# Patient Record
Sex: Female | Born: 1990 | Race: Black or African American | Hispanic: No | Marital: Married | Smoking: Never smoker
Health system: Southern US, Community
[De-identification: ages and names within clinical notes are randomized; demographics above are authoritative.]

## PROBLEM LIST (undated history)

## (undated) ENCOUNTER — Inpatient Hospital Stay (HOSPITAL_COMMUNITY): Payer: Self-pay

## (undated) DIAGNOSIS — Z789 Other specified health status: Secondary | ICD-10-CM

## (undated) HISTORY — PX: NO PAST SURGERIES: SHX2092

---

## 2017-01-16 ENCOUNTER — Inpatient Hospital Stay (HOSPITAL_COMMUNITY)
Admission: AD | Admit: 2017-01-16 | Discharge: 2017-01-16 | Disposition: A | Discharge status: Home / Self Care | Payer: Medicaid Other | Source: Ambulatory Visit | Attending: Obstetrics & Gynecology | Admitting: Obstetrics & Gynecology

## 2017-01-16 ENCOUNTER — Encounter (HOSPITAL_COMMUNITY): Payer: Self-pay

## 2017-01-16 DIAGNOSIS — E860 Dehydration: Secondary | ICD-10-CM | POA: Diagnosis not present

## 2017-01-16 DIAGNOSIS — O219 Vomiting of pregnancy, unspecified: Secondary | ICD-10-CM | POA: Insufficient documentation

## 2017-01-16 DIAGNOSIS — Z3A01 Less than 8 weeks gestation of pregnancy: Secondary | ICD-10-CM | POA: Insufficient documentation

## 2017-01-16 DIAGNOSIS — O99281 Endocrine, nutritional and metabolic diseases complicating pregnancy, first trimester: Secondary | ICD-10-CM | POA: Insufficient documentation

## 2017-01-16 HISTORY — DX: Other specified health status: Z78.9

## 2017-01-16 LAB — COMPREHENSIVE METABOLIC PANEL
ALBUMIN: 4.3 g/dL (ref 3.5–5.0)
ALK PHOS: 33 U/L — AB (ref 38–126)
ALT: 16 U/L (ref 14–54)
AST: 23 U/L (ref 15–41)
Anion gap: 11 (ref 5–15)
BILIRUBIN TOTAL: 1.2 mg/dL (ref 0.3–1.2)
BUN: 9 mg/dL (ref 6–20)
CALCIUM: 9.6 mg/dL (ref 8.9–10.3)
CHLORIDE: 102 mmol/L (ref 101–111)
CO2: 20 mmol/L — ABNORMAL LOW (ref 22–32)
CREATININE: 0.7 mg/dL (ref 0.44–1.00)
Glucose, Bld: 64 mg/dL — ABNORMAL LOW (ref 65–99)
Potassium: 3.7 mmol/L (ref 3.5–5.1)
Sodium: 133 mmol/L — ABNORMAL LOW (ref 135–145)
TOTAL PROTEIN: 7.9 g/dL (ref 6.5–8.1)

## 2017-01-16 LAB — CBC
HCT: 37.8 % (ref 36.0–46.0)
Hemoglobin: 13 g/dL (ref 12.0–15.0)
MCH: 30.7 pg (ref 26.0–34.0)
MCHC: 34.4 g/dL (ref 30.0–36.0)
MCV: 89.2 fL (ref 78.0–100.0)
PLATELETS: 250 10*3/uL (ref 150–400)
RBC: 4.24 MIL/uL (ref 3.87–5.11)
RDW: 12.6 % (ref 11.5–15.5)
WBC: 5.2 10*3/uL (ref 4.0–10.5)

## 2017-01-16 LAB — URINALYSIS, ROUTINE W REFLEX MICROSCOPIC
Bilirubin Urine: NEGATIVE
GLUCOSE, UA: NEGATIVE mg/dL
KETONES UR: 80 mg/dL — AB
Nitrite: NEGATIVE
PH: 5 (ref 5.0–8.0)
PROTEIN: 100 mg/dL — AB
Specific Gravity, Urine: 1.031 — ABNORMAL HIGH (ref 1.005–1.030)

## 2017-01-16 LAB — POCT PREGNANCY, URINE: Preg Test, Ur: POSITIVE — AB

## 2017-01-16 MED ORDER — PROMETHAZINE HCL 25 MG PO TABS: 25.0000 mg | ORAL_TABLET | Freq: Four times a day (QID) | ORAL | 0 refills | Status: DC | PRN

## 2017-01-16 MED ORDER — PROMETHAZINE HCL 25 MG/ML IJ SOLN
25.0000 mg | Freq: Once | INTRAMUSCULAR | Status: AC
  Administered 2017-01-16: 25 mg via INTRAVENOUS
  Filled 2017-01-16: qty 1

## 2017-01-16 MED ORDER — LACTATED RINGERS IV BOLUS (SEPSIS)
1000.0000 mL | Freq: Once | INTRAVENOUS | Status: AC
  Administered 2017-01-16: 1000 mL via INTRAVENOUS

## 2017-01-16 MED ORDER — FAMOTIDINE IN NACL 20-0.9 MG/50ML-% IV SOLN
20.0000 mg | Freq: Once | INTRAVENOUS | Status: AC
  Administered 2017-01-16: 20 mg via INTRAVENOUS
  Filled 2017-01-16: qty 50

## 2017-01-16 MED ORDER — M.V.I. ADULT IV INJ
Freq: Once | INTRAVENOUS | Status: AC
  Administered 2017-01-16: 12:00:00 via INTRAVENOUS
  Filled 2017-01-16: qty 10

## 2017-01-16 NOTE — MAU Provider Note (Signed)
History     CSN: [id number]  Arrival date and time: 01/16/17 4098   None     Chief Complaint  Patient presents with  . Emesis During Pregnancy   G1 @[redacted]w[redacted]d  here with N/V x1 week. She can only tolerate some cold water. In the last 24 hrs she has vomited 5+ times. No fevers. No diarrhea. No pain or bleeding. Reports 5 lb weight loss.     OB History    Gravida Para Term Preterm AB Living   1             SAB TAB Ectopic Multiple Live Births                  Past Medical History:  Diagnosis Date  . Medical history non-contributory     Past Surgical History:  Procedure Laterality Date  . NO PAST SURGERIES      History reviewed. No pertinent family history.  Social History  Substance Use Topics  . Smoking status: Never Smoker  . Smokeless tobacco: Never Used  . Alcohol use No    Allergies: No Known Allergies  No prescriptions prior to admission.    Review of Systems  Constitutional: Negative for fever.  Gastrointestinal: Positive for nausea and vomiting. Negative for abdominal pain.  Genitourinary: Negative for vaginal bleeding.  Neurological: Positive for weakness and light-headedness.   Physical Exam   Blood pressure 105/61, pulse 86, temperature 98.6 F (37 C), temperature source Oral, resp. rate 18, height 5\' 5"  (1.651 m), weight 102.5 kg (226 lb 1.3 oz), last menstrual period 11/28/2016.  Physical Exam  Nursing note and vitals reviewed. Constitutional: She is oriented to person, place, and time. She appears well-developed and well-nourished. No distress.  HENT:  Head: Normocephalic and atraumatic.  Neck: Normal range of motion.  Cardiovascular: Normal rate.   Respiratory: Effort normal.  Musculoskeletal: Normal range of motion.  Neurological: She is alert and oriented to person, place, and time.  Skin: Skin is warm and dry.  Psychiatric: She has a normal mood and affect.   Results for orders placed or performed during the hospital encounter of  01/16/17 (from the past 24 hour(s))  Urinalysis, Routine w reflex microscopic     Status: Abnormal   Collection Time: 01/16/17  8:37 AM  Result Value Ref Range   Color, Urine YELLOW YELLOW   APPearance HAZY (A) CLEAR   Specific Gravity, Urine 1.031 (H) 1.005 - 1.030   pH 5.0 5.0 - 8.0   Glucose, UA NEGATIVE NEGATIVE mg/dL   Hgb urine dipstick MODERATE (A) NEGATIVE   Bilirubin Urine NEGATIVE NEGATIVE   Ketones, ur 80 (A) NEGATIVE mg/dL   Protein, ur 119 (A) NEGATIVE mg/dL   Nitrite NEGATIVE NEGATIVE   Leukocytes, UA MODERATE (A) NEGATIVE   RBC / HPF 0-5 0 - 5 RBC/hpf   WBC, UA 6-30 0 - 5 WBC/hpf   Bacteria, UA MANY (A) NONE SEEN   Squamous Epithelial / LPF 0-5 (A) NONE SEEN   Mucous PRESENT   Pregnancy, urine POC     Status: Abnormal   Collection Time: 01/16/17  8:57 AM  Result Value Ref Range   Preg Test, Ur POSITIVE (A) NEGATIVE  Comprehensive metabolic panel     Status: Abnormal   Collection Time: 01/16/17  9:25 AM  Result Value Ref Range   Sodium 133 (L) 135 - 145 mmol/L   Potassium 3.7 3.5 - 5.1 mmol/L   Chloride 102 101 - 111 mmol/L   CO2  20 (L) 22 - 32 mmol/L   Glucose, Bld 64 (L) 65 - 99 mg/dL   BUN 9 6 - 20 mg/dL   Creatinine, Ser 1.610.70 0.44 - 1.00 mg/dL   Calcium 9.6 8.9 - 09.610.3 mg/dL   Total Protein 7.9 6.5 - 8.1 g/dL   Albumin 4.3 3.5 - 5.0 g/dL   AST 23 15 - 41 U/L   ALT 16 14 - 54 U/L   Alkaline Phosphatase 33 (L) 38 - 126 U/L   Total Bilirubin 1.2 0.3 - 1.2 mg/dL   GFR calc non Af Amer >60 >60 mL/min   GFR calc Af Amer >60 >60 mL/min   Anion gap 11 5 - 15  CBC     Status: None   Collection Time: 01/16/17  9:25 AM  Result Value Ref Range   WBC 5.2 4.0 - 10.5 K/uL   RBC 4.24 3.87 - 5.11 MIL/uL   Hemoglobin 13.0 12.0 - 15.0 g/dL   HCT 04.537.8 40.936.0 - 81.146.0 %   MCV 89.2 78.0 - 100.0 fL   MCH 30.7 26.0 - 34.0 pg   MCHC 34.4 30.0 - 36.0 g/dL   RDW 91.412.6 78.211.5 - 95.615.5 %   Platelets 250 150 - 400 K/uL    MAU Course  Procedures LR 1 L MTV 1 L Phenergan   Pepcid  MDM Labs ordered and reviewed. Tolerating po since meds. No emesis. Many bacteria on UA-UC sent. Stable for discharge home.   Assessment and Plan   1. [redacted] weeks gestation of pregnancy   2. Nausea/vomiting in pregnancy   3. Dehydration    Discharge home Follow up with OB provider of choice to start care Rx Phenergan Diclegis OTC regimen  Allergies as of 01/16/2017   No Known Allergies     Medication List    TAKE these medications   prenatal multivitamin Tabs tablet Take 1 tablet by mouth daily at 12 noon.   promethazine 25 MG tablet Commonly known as:  PHENERGAN Take 1 tablet (25 mg total) by mouth every 6 (six) hours as needed for nausea or vomiting.      Donette LarryMelanie Rim Thatch, CNM 01/16/2017, 10:38 AM

## 2017-01-16 NOTE — Discharge Instructions (Signed)
Morning Sickness °Morning sickness is when you feel sick to your stomach (nauseous) during pregnancy. This nauseous feeling may or may not come with vomiting. It often occurs in the morning but can be a problem any time of day. Morning sickness is most common during the first trimester, but it may continue throughout pregnancy. While morning sickness is unpleasant, it is usually harmless unless you develop severe and continual vomiting (hyperemesis gravidarum). This condition requires more intense treatment. °What are the causes? °The cause of morning sickness is not completely known but seems to be related to normal hormonal changes that occur in pregnancy. °What increases the risk? °You are at greater risk if you: °· Experienced nausea or vomiting before your pregnancy. °· Had morning sickness during a previous pregnancy. °· Are pregnant with more than one baby, such as twins. ° °How is this treated? °Do not use any medicines (prescription, over-the-counter, or herbal) for morning sickness without first talking to your health care provider. Your health care provider may prescribe or recommend: °· Vitamin B6 supplements. °· Anti-nausea medicines. °· The herbal medicine ginger. ° °Follow these instructions at home: °· Only take over-the-counter or prescription medicines as directed by your health care provider. °· Taking multivitamins before getting pregnant can prevent or decrease the severity of morning sickness in most women. °· Eat a piece of dry toast or unsalted crackers before getting out of bed in the morning. °· Eat five or six small meals a day. °· Eat dry and bland foods (rice, baked potato). Foods high in carbohydrates are often helpful. °· Do not drink liquids with your meals. Drink liquids between meals. °· Avoid greasy, fatty, and spicy foods. °· Get someone to cook for you if the smell of any food causes nausea and vomiting. °· If you feel nauseous after taking prenatal vitamins, take the vitamins at  night or with a snack. °· Snack on protein foods (nuts, yogurt, cheese) between meals if you are hungry. °· Eat unsweetened gelatins for desserts. °· Wearing an acupressure wristband (worn for sea sickness) may be helpful. °· Acupuncture may be helpful. °· Do not smoke. °· Get a humidifier to keep the air in your house free of odors. °· Get plenty of fresh air. °Contact a health care provider if: °· Your home remedies are not working, and you need medicine. °· You feel dizzy or lightheaded. °· You are losing weight. °Get help right away if: °· You have persistent and uncontrolled nausea and vomiting. °· You pass out (faint). °This information is not intended to replace advice given to you by your health care provider. Make sure you discuss any questions you have with your health care provider. °Document Released: 10/03/2006 Document Revised: 01/18/2016 Document Reviewed: 01/27/2013 °Elsevier Interactive Patient Education © 2017 [employer] ° °

## 2017-01-16 NOTE — MAU Note (Signed)
Pt reports she is pregnant and has not been eating. Has had N/V  Since last week. Stated her emesis looks like it has blood in it it is reddish/brown color.

## 2017-01-17 LAB — CULTURE, OB URINE

## 2017-01-23 ENCOUNTER — Inpatient Hospital Stay (HOSPITAL_COMMUNITY)
Admission: AD | Admit: 2017-01-23 | Discharge: 2017-01-23 | Disposition: A | Discharge status: Home / Self Care | Payer: Medicaid Other | Source: Ambulatory Visit | Attending: Family Medicine | Admitting: Family Medicine

## 2017-01-23 ENCOUNTER — Encounter (HOSPITAL_COMMUNITY): Payer: Self-pay | Admitting: *Deleted

## 2017-01-23 DIAGNOSIS — O210 Mild hyperemesis gravidarum: Secondary | ICD-10-CM | POA: Insufficient documentation

## 2017-01-23 DIAGNOSIS — E860 Dehydration: Secondary | ICD-10-CM | POA: Diagnosis not present

## 2017-01-23 DIAGNOSIS — O99281 Endocrine, nutritional and metabolic diseases complicating pregnancy, first trimester: Secondary | ICD-10-CM | POA: Insufficient documentation

## 2017-01-23 DIAGNOSIS — R1110 Vomiting, unspecified: Secondary | ICD-10-CM | POA: Diagnosis present

## 2017-01-23 DIAGNOSIS — Z3A08 8 weeks gestation of pregnancy: Secondary | ICD-10-CM | POA: Insufficient documentation

## 2017-01-23 LAB — URINALYSIS, ROUTINE W REFLEX MICROSCOPIC
BILIRUBIN URINE: NEGATIVE
GLUCOSE, UA: NEGATIVE mg/dL
KETONES UR: 80 mg/dL — AB
Nitrite: NEGATIVE
PH: 5 (ref 5.0–8.0)
PROTEIN: 30 mg/dL — AB
Specific Gravity, Urine: 1.028 (ref 1.005–1.030)

## 2017-01-23 MED ORDER — PROMETHAZINE HCL 25 MG/ML IJ SOLN
25.0000 mg | Freq: Once | INTRAMUSCULAR | Status: AC
  Administered 2017-01-23: 25 mg via INTRAVENOUS
  Filled 2017-01-23: qty 1

## 2017-01-23 MED ORDER — VITAMIN B-6 50 MG PO TABS: 50.0000 mg | ORAL_TABLET | Freq: Every day | ORAL | 0 refills | Status: DC

## 2017-01-23 MED ORDER — LACTATED RINGERS IV BOLUS (SEPSIS)
1000.0000 mL | Freq: Once | INTRAVENOUS | Status: AC
  Administered 2017-01-23: 1000 mL via INTRAVENOUS

## 2017-01-23 MED ORDER — DOXYLAMINE SUCCINATE (SLEEP) 25 MG PO TABS: 25.0000 mg | ORAL_TABLET | Freq: Every day | ORAL | 0 refills | Status: DC

## 2017-01-23 NOTE — MAU Note (Signed)
PT  SAYS  SHE CAME  HERE ON 5-24- FOR VOMITING - WAS DEHYDRATED-   GAVE  IV FLUIDS.  GAVE    PHENERGAN-   THEN  ABLE  TO EAT.    SAYS ON Tuesday  STARTED  VOMITING-  TOOK  PHENERGAN - BUT  DIDN'T  WORK . NOT  VOMITED  TODAY- BUT  NOT EATING

## 2017-01-23 NOTE — Discharge Instructions (Signed)
Morning Sickness °Morning sickness is when you feel sick to your stomach (nauseous) during pregnancy. This nauseous feeling may or may not come with vomiting. It often occurs in the morning but can be a problem any time of day. Morning sickness is most common during the first trimester, but it may continue throughout pregnancy. While morning sickness is unpleasant, it is usually harmless unless you develop severe and continual vomiting (hyperemesis gravidarum). This condition requires more intense treatment. °What are the causes? °The cause of morning sickness is not completely known but seems to be related to normal hormonal changes that occur in pregnancy. °What increases the risk? °You are at greater risk if you: °· Experienced nausea or vomiting before your pregnancy. °· Had morning sickness during a previous pregnancy. °· Are pregnant with more than one baby, such as twins. ° °How is this treated? °Do not use any medicines (prescription, over-the-counter, or herbal) for morning sickness without first talking to your health care provider. Your health care provider may prescribe or recommend: °· Vitamin B6 supplements. °· Anti-nausea medicines. °· The herbal medicine ginger. ° °Follow these instructions at home: °· Only take over-the-counter or prescription medicines as directed by your health care provider. °· Taking multivitamins before getting pregnant can prevent or decrease the severity of morning sickness in most women. °· Eat a piece of dry toast or unsalted crackers before getting out of bed in the morning. °· Eat five or six small meals a day. °· Eat dry and bland foods (rice, baked potato). Foods high in carbohydrates are often helpful. °· Do not drink liquids with your meals. Drink liquids between meals. °· Avoid greasy, fatty, and spicy foods. °· Get someone to cook for you if the smell of any food causes nausea and vomiting. °· If you feel nauseous after taking prenatal vitamins, take the vitamins at  night or with a snack. °· Snack on protein foods (nuts, yogurt, cheese) between meals if you are hungry. °· Eat unsweetened gelatins for desserts. °· Wearing an acupressure wristband (worn for sea sickness) may be helpful. °· Acupuncture may be helpful. °· Do not smoke. °· Get a humidifier to keep the air in your house free of odors. °· Get plenty of fresh air. °Contact a health care provider if: °· Your home remedies are not working, and you need medicine. °· You feel dizzy or lightheaded. °· You are losing weight. °Get help right away if: °· You have persistent and uncontrolled nausea and vomiting. °· You pass out (faint). °This information is not intended to replace advice given to you by your health care provider. Make sure you discuss any questions you have with your health care provider. °Document Released: 10/03/2006 Document Revised: 01/18/2016 Document Reviewed: 01/27/2013 °Elsevier Interactive Patient Education © 2017 [employer] ° ° °Eating Plan for Hyperemesis Gravidarum °Hyperemesis gravidarum is a severe form of morning sickness. Because this condition causes severe nausea and vomiting, it can lead to dehydration, malnutrition, and weight loss. One way to lessen the symptoms of nausea and vomiting is to follow the eating plan for hyperemesis gravidarum. It is often used along with prescribed medicines to control your symptoms. °What can I do to relieve my symptoms? °Listen to your body. Everyone is different and has different preferences. Find what works best for you. Take any of the following actions that are helpful to you: °· Eat and drink slowly. °· Eat 5-6 small meals daily instead of 3 large meals. °· Eat crackers before you get   out of bed in the morning. °· Try having a snack in the middle of the night. °· Starchy foods are usually tolerated well. Examples include cereal, toast, bread, potatoes, pasta, rice, and pretzels. °· Ginger may help with nausea. Add ¼ tsp ground ginger to hot tea or  choose ginger tea. °· Try drinking 100% fruit juice or an electrolyte drink. An electrolyte drink contains sodium, potassium, and chloride. °· Continue to take your prenatal vitamins as told by your health care provider. If you are having trouble taking your prenatal vitamins, talk with your health care provider about different options. °· Include at least 1 serving of protein with your meals and snacks. Protein options include meats or poultry, beans, nuts, eggs, and yogurt. Try eating a protein-rich snack before bed. Examples of these snacks include cheese and crackers or half of a peanut butter or turkey sandwich. °· Consider eliminating foods that trigger your symptoms. These may include spicy foods, coffee, high-fat foods, very sweet foods, and acidic foods. °· Try meals that have more protein combined with bland, salty, lower-fat, and dry foods, such as nuts, seeds, pretzels, crackers, and cereal. °· Talk with your healthcare provider about starting a supplement of vitamin B6. °· Have fluids that are cold, clear, and carbonated or sour. Examples include lemonade, ginger ale, lemon-lime soda, ice water, and sparkling water. °· Try lemon or mint tea. °· Try brushing your teeth or using a mouth rinse after meals. ° °What should I avoid to reduce my symptoms? °Avoiding some of the following things may help reduce your symptoms. °· Foods with strong smells. Try eating meals in well-ventilated areas that are free of odors. °· Drinking water or other beverages with meals. Try not to drink anything during the 30 minutes before and after your meals. °· Drinking more than 1 cup of fluid at a time. Sometimes using a straw helps. °· Fried or high-fat foods, such as butter and cream sauces. °· Spicy foods. °· Skipping meals as best as you can. Nausea can be more intense on an empty stomach. If you cannot tolerate food at that time, do not force it. Try sucking on ice chips or other frozen items, and make up for missed  calories later. °· Lying down within 2 hours after eating. °· Environmental triggers. These may include smoky rooms, closed spaces, rooms with strong smells, warm or humid places, overly loud and noisy rooms, and rooms with motion or flickering lights. °· Quick and sudden changes in your movement. ° °This information is not intended to replace advice given to you by your health care provider. Make sure you discuss any questions you have with your health care provider. °Document Released: 06/09/2007 Document Revised: 04/10/2016 Document Reviewed: 03/12/2016 °Elsevier Interactive Patient Education © 2018 Else[employer] Inc. ° °

## 2017-01-23 NOTE — MAU Provider Note (Signed)
History     CSN: [id number]  Arrival date and time: 01/23/17 1903  Chief Complaint  Patient presents with  . Emesis   G1 @[redacted]w[redacted]d  here with N/V. She had one episode of emesis 2 days ago and 2 episodes yesterday. Reports poor appetite today. She had water this am and tolerated ok. She was seen last week for similar sx and given Phenergan Rx. She was originally taking it up to 4 times a day then just once a day and did not take any today. No abdominal pain or VB. She's had 1 lb weight loss since last week.    OB History    Gravida Para Term Preterm AB Living   1             SAB TAB Ectopic Multiple Live Births                  Past Medical History:  Diagnosis Date  . Medical history non-contributory     Past Surgical History:  Procedure Laterality Date  . NO PAST SURGERIES      History reviewed. No pertinent family history.  Social History  Substance Use Topics  . Smoking status: Never Smoker  . Smokeless tobacco: Never Used  . Alcohol use No    Allergies: No Known Allergies  Prescriptions Prior to Admission  Medication Sig Dispense Refill Last Dose  . Prenatal Vit-Fe Fumarate-FA (PRENATAL MULTIVITAMIN) TABS tablet Take 1 tablet by mouth daily at 12 noon.   Past Week at Unknown time  . promethazine (PHENERGAN) 25 MG tablet Take 1 tablet (25 mg total) by mouth every 6 (six) hours as needed for nausea or vomiting. 30 tablet 0     Review of Systems  Constitutional: Negative for fever.  Gastrointestinal: Positive for nausea and vomiting. Negative for abdominal pain.  Genitourinary: Negative for vaginal bleeding.   Physical Exam   Blood pressure (!) 105/56, pulse 88, temperature 98.9 F (37.2 C), temperature source Oral, resp. rate 20, height 5\' 5"  (1.651 m), weight 102.2 kg (225 lb 4 oz), last menstrual period 11/28/2016.  Physical Exam  Constitutional: She is oriented to person, place, and time. She appears well-developed and well-nourished. No distress.  HENT:   Head: Normocephalic and atraumatic.  Cardiovascular: Normal rate.   Respiratory: Effort normal.  Musculoskeletal: Normal range of motion.  Neurological: She is alert and oriented to person, place, and time.  Skin: Skin is warm and dry.  Psychiatric: She has a normal mood and affect.   Results for orders placed or performed during the hospital encounter of 01/23/17 (from the past 24 hour(s))  Urinalysis, Routine w reflex microscopic     Status: Abnormal   Collection Time: 01/23/17  7:20 PM  Result Value Ref Range   Color, Urine YELLOW YELLOW   APPearance HAZY (A) CLEAR   Specific Gravity, Urine 1.028 1.005 - 1.030   pH 5.0 5.0 - 8.0   Glucose, UA NEGATIVE NEGATIVE mg/dL   Hgb urine dipstick MODERATE (A) NEGATIVE   Bilirubin Urine NEGATIVE NEGATIVE   Ketones, ur 80 (A) NEGATIVE mg/dL   Protein, ur 30 (A) NEGATIVE mg/dL   Nitrite NEGATIVE NEGATIVE   Leukocytes, UA SMALL (A) NEGATIVE   RBC / HPF 0-5 0 - 5 RBC/hpf   WBC, UA 6-30 0 - 5 WBC/hpf   Bacteria, UA RARE (A) NONE SEEN   Squamous Epithelial / LPF 0-5 (A) NONE SEEN   Mucous PRESENT    MAU Course  Procedures LR 1  L bolus Phenergan 25 mg IV  MDM Labs ordered and reviewed. No emesis. Tolerating po food and fluids. Discussed Diclegis OTC regimen. Stable for discharge home.   Assessment and Plan   1. [redacted] weeks gestation of pregnancy   2. Dehydration   3. Morning sickness    Discharge home Rx B6 Rx Unisom tab Continue Phenergan HEG diet Follow up with OB of choice in 2 weeks to start care  Allergies as of 01/23/2017   No Known Allergies     Medication List    TAKE these medications   doxylamine (Sleep) 25 MG tablet Commonly known as:  UNISOM Take 1 tablet (25 mg total) by mouth at bedtime.   prenatal multivitamin Tabs tablet Take 1 tablet by mouth daily at 12 noon.   promethazine 25 MG tablet Commonly known as:  PHENERGAN Take 1 tablet (25 mg total) by mouth every 6 (six) hours as needed for nausea or  vomiting.   pyridOXINE 50 MG tablet Commonly known as:  VITAMIN B-6 Take 1 tablet (50 mg total) by mouth at bedtime.      Donette LarryMelanie Rodderick Holtzer, CNM 01/23/2017, 8:37 PM

## 2017-01-29 ENCOUNTER — Encounter (HOSPITAL_COMMUNITY): Payer: Self-pay

## 2017-01-29 ENCOUNTER — Inpatient Hospital Stay (HOSPITAL_COMMUNITY)
Admission: AD | Admit: 2017-01-29 | Discharge: 2017-01-29 | Disposition: A | Discharge status: Home / Self Care | Payer: Medicaid Other | Source: Ambulatory Visit | Attending: Family Medicine | Admitting: Family Medicine

## 2017-01-29 DIAGNOSIS — Z3A08 8 weeks gestation of pregnancy: Secondary | ICD-10-CM | POA: Diagnosis not present

## 2017-01-29 DIAGNOSIS — O99281 Endocrine, nutritional and metabolic diseases complicating pregnancy, first trimester: Secondary | ICD-10-CM | POA: Insufficient documentation

## 2017-01-29 DIAGNOSIS — Z79899 Other long term (current) drug therapy: Secondary | ICD-10-CM | POA: Insufficient documentation

## 2017-01-29 DIAGNOSIS — E860 Dehydration: Secondary | ICD-10-CM | POA: Insufficient documentation

## 2017-01-29 DIAGNOSIS — O219 Vomiting of pregnancy, unspecified: Secondary | ICD-10-CM | POA: Diagnosis not present

## 2017-01-29 LAB — COMPREHENSIVE METABOLIC PANEL
ALT: 16 U/L (ref 14–54)
AST: 19 U/L (ref 15–41)
Albumin: 4.5 g/dL (ref 3.5–5.0)
Alkaline Phosphatase: 38 U/L (ref 38–126)
Anion gap: 11 (ref 5–15)
BUN: 9 mg/dL (ref 6–20)
CHLORIDE: 103 mmol/L (ref 101–111)
CO2: 21 mmol/L — AB (ref 22–32)
Calcium: 10 mg/dL (ref 8.9–10.3)
Creatinine, Ser: 0.74 mg/dL (ref 0.44–1.00)
GFR calc Af Amer: >60 mL/min (ref >60)
GFR calc non Af Amer: >60 mL/min (ref >60)
Glucose, Bld: 69 mg/dL (ref 65–99)
Potassium: 4.3 mmol/L (ref 3.5–5.1)
Sodium: 135 mmol/L (ref 135–145)
Total Bilirubin: 0.8 mg/dL (ref 0.3–1.2)
Total Protein: 8.3 g/dL — ABNORMAL HIGH (ref 6.5–8.1)

## 2017-01-29 LAB — CBC
HEMATOCRIT: 35.6 % — AB (ref 36.0–46.0)
Hemoglobin: 12.6 g/dL (ref 12.0–15.0)
MCH: 31.6 pg (ref 26.0–34.0)
MCHC: 35.4 g/dL (ref 30.0–36.0)
MCV: 89.2 fL (ref 78.0–100.0)
PLATELETS: 265 10*3/uL (ref 150–400)
RBC: 3.99 MIL/uL (ref 3.87–5.11)
RDW: 12.8 % (ref 11.5–15.5)
WBC: 5.4 10*3/uL (ref 4.0–10.5)

## 2017-01-29 LAB — URINALYSIS, ROUTINE W REFLEX MICROSCOPIC
Bilirubin Urine: NEGATIVE
Glucose, UA: NEGATIVE mg/dL
Ketones, ur: 80 mg/dL — AB
Nitrite: NEGATIVE
PROTEIN: 100 mg/dL — AB
Specific Gravity, Urine: 1.023 (ref 1.005–1.030)
pH: 5 (ref 5.0–8.0)

## 2017-01-29 MED ORDER — PROMETHAZINE HCL 25 MG PO TABS: 25.0000 mg | ORAL_TABLET | Freq: Four times a day (QID) | ORAL | 2 refills | Status: DC | PRN

## 2017-01-29 MED ORDER — PROMETHAZINE HCL 25 MG/ML IJ SOLN
25.0000 mg | Freq: Once | INTRAMUSCULAR | Status: AC
  Administered 2017-01-29: 25 mg via INTRAVENOUS
  Filled 2017-01-29: qty 1

## 2017-01-29 MED ORDER — PROMETHAZINE HCL 25 MG RE SUPP: 25.0000 mg | Freq: Four times a day (QID) | RECTAL | 0 refills | Status: DC | PRN

## 2017-01-29 MED ORDER — DEXTROSE 5 % IN LACTATED RINGERS IV BOLUS
1000.0000 mL | Freq: Once | INTRAVENOUS | Status: AC
  Administered 2017-01-29: 1000 mL via INTRAVENOUS

## 2017-01-29 MED ORDER — LACTATED RINGERS IV BOLUS (SEPSIS)
1000.0000 mL | Freq: Once | INTRAVENOUS | Status: AC
  Administered 2017-01-29: 1000 mL via INTRAVENOUS

## 2017-01-29 NOTE — MAU Provider Note (Signed)
History     CSN: [id number]  Arrival date and time: 01/29/17 0843   None     Chief Complaint  Patient presents with  . Nausea  . Emesis   HPI   Myrta is a 26 yo G1PO, [redacted]w[redacted]d determined by LMP who presents to the maternity admissions unit with complaints of nausea and vomiting.  She reports that she vomited twice yesterday, the first after eating small meal, and then in late last night after lying down.  She vomited this morning and says that she had nothing to eat.  She was seen on 5/31 for similar complaints and was given Phenergan Rx and says that she will throw up her medication as soon as she takes it.  This has been going on for 3-4 weeks.  She admits to dizziness and fatigue, and  denies any abdominal pain, vaginal bleeding or discharge, vaginal pain, constipation, or diarrhea.  OB History    Gravida Para Term Preterm AB Living   1             SAB TAB Ectopic Multiple Live Births                  Past Medical History:  Diagnosis Date  . Medical history non-contributory     Past Surgical History:  Procedure Laterality Date  . NO PAST SURGERIES      History reviewed. No pertinent family history.  Social History  Substance Use Topics  . Smoking status: Never Smoker  . Smokeless tobacco: Never Used  . Alcohol use No    Allergies: No Known Allergies  Prescriptions Prior to Admission  Medication Sig Dispense Refill Last Dose  . promethazine (PHENERGAN) 25 MG tablet Take 1 tablet (25 mg total) by mouth every 6 (six) hours as needed for nausea or vomiting. 30 tablet 0 01/28/2017 at Unknown time  . doxylamine, Sleep, (UNISOM) 25 MG tablet Take 1 tablet (25 mg total) by mouth at bedtime. 30 tablet 0 01/27/2017  . Prenatal Vit-Fe Fumarate-FA (PRENATAL MULTIVITAMIN) TABS tablet Take 1 tablet by mouth daily at 12 noon.   01/27/2017  . pyridOXINE (VITAMIN B-6) 50 MG tablet Take 1 tablet (50 mg total) by mouth at bedtime. 90 tablet 0 01/27/2017    Review of Systems   Constitutional: Positive for fatigue.  Gastrointestinal: Positive for nausea and vomiting. Negative for constipation and diarrhea.  Genitourinary: Negative for vaginal bleeding, vaginal discharge and vaginal pain.  Neurological: Positive for dizziness and light-headedness.   Physical Exam   Results for orders placed or performed during the hospital encounter of 01/29/17 (from the past 24 hour(s))  Urinalysis, Routine w reflex microscopic     Status: Abnormal   Collection Time: 01/29/17  9:00 AM  Result Value Ref Range   Color, Urine YELLOW YELLOW   APPearance HAZY (A) CLEAR   Specific Gravity, Urine 1.023 1.005 - 1.030   pH 5.0 5.0 - 8.0   Glucose, UA NEGATIVE NEGATIVE mg/dL   Hgb urine dipstick SMALL (A) NEGATIVE   Bilirubin Urine NEGATIVE NEGATIVE   Ketones, ur 80 (A) NEGATIVE mg/dL   Protein, ur 782 (A) NEGATIVE mg/dL   Nitrite NEGATIVE NEGATIVE   Leukocytes, UA TRACE (A) NEGATIVE   RBC / HPF 0-5 0 - 5 RBC/hpf   WBC, UA 6-30 0 - 5 WBC/hpf   Bacteria, UA MANY (A) NONE SEEN   Squamous Epithelial / LPF 0-5 (A) NONE SEEN   Mucous PRESENT   CBC  Status: Abnormal   Collection Time: 01/29/17  9:34 AM  Result Value Ref Range   WBC 5.4 4.0 - 10.5 K/uL   RBC 3.99 3.87 - 5.11 MIL/uL   Hemoglobin 12.6 12.0 - 15.0 g/dL   HCT 96.035.6 (L) 45.436.0 - 09.846.0 %   MCV 89.2 78.0 - 100.0 fL   MCH 31.6 26.0 - 34.0 pg   MCHC 35.4 30.0 - 36.0 g/dL   RDW 11.912.8 14.711.5 - 82.915.5 %   Platelets 265 150 - 400 K/uL   Blood pressure (!) 99/55, pulse 78, temperature 98.3 F (36.8 C), temperature source Oral, resp. rate 18, weight 99.3 kg (219 lb), last menstrual period 11/28/2016, SpO2 100 %.  Physical Exam  Constitutional: She is oriented to person, place, and time. She appears well-developed and well-nourished.  Cardiovascular: Normal rate, regular rhythm and normal heart sounds.   Respiratory: Effort normal and breath sounds normal.  GI: Soft. Bowel sounds are normal. There is no tenderness.   Neurological: She is alert and oriented to person, place, and time.  Skin: Skin is warm and dry.  Psychiatric: She has a normal mood and affect. Her behavior is normal.    MAU Course  Procedures  MDM CBC CMP UA Phenergan 25 mg IV LR 1 L D5LR x 1 L Labs ordered and reviewed.     Assessment and Plan  1. [redacted] weeks gestation of pregnancy 2. Nausea and Vomiting associated with pregnancy 3. Dehydration   Renew Rx for Phenergan PO and Rx for Phenergan suppositories. Pt to continue Unisom Q HS [facility].  Jeanann LewandowskyBethany Eaton, PA-S 01/29/2017, 9:59 AM   I confirm that I have verified the information documented in the physician assistant student's note and that I have also personally performed the physical exam and all medical decision making activities.

## 2017-01-29 NOTE — MAU Note (Signed)
+  nausea/vomiting x2-3 weeks States has been seen multiple times for this. Still unable to eat or drink anything. Cant hold down medication.

## 2017-02-13 ENCOUNTER — Other Ambulatory Visit (HOSPITAL_COMMUNITY): Payer: Self-pay | Admitting: Nurse Practitioner

## 2017-02-13 DIAGNOSIS — Z3682 Encounter for antenatal screening for nuchal translucency: Secondary | ICD-10-CM

## 2017-02-22 ENCOUNTER — Other Ambulatory Visit (HOSPITAL_COMMUNITY): Payer: Self-pay | Admitting: Advanced Practice Midwife

## 2017-02-23 ENCOUNTER — Inpatient Hospital Stay (HOSPITAL_COMMUNITY)
Admission: AD | Admit: 2017-02-23 | Discharge: 2017-02-23 | Disposition: A | Discharge status: Home / Self Care | Payer: Medicaid Other | Source: Ambulatory Visit | Attending: Family Medicine | Admitting: Family Medicine

## 2017-02-23 ENCOUNTER — Encounter (HOSPITAL_COMMUNITY): Payer: Self-pay | Admitting: *Deleted

## 2017-02-23 DIAGNOSIS — Z3A12 12 weeks gestation of pregnancy: Secondary | ICD-10-CM | POA: Insufficient documentation

## 2017-02-23 DIAGNOSIS — O210 Mild hyperemesis gravidarum: Secondary | ICD-10-CM | POA: Diagnosis not present

## 2017-02-23 DIAGNOSIS — O219 Vomiting of pregnancy, unspecified: Secondary | ICD-10-CM | POA: Diagnosis present

## 2017-02-23 LAB — URINALYSIS, ROUTINE W REFLEX MICROSCOPIC
BILIRUBIN URINE: NEGATIVE
Glucose, UA: NEGATIVE mg/dL
KETONES UR: 80 mg/dL — AB
Nitrite: NEGATIVE
PH: 5 (ref 5.0–8.0)
PROTEIN: 100 mg/dL — AB
Specific Gravity, Urine: 1.03 (ref 1.005–1.030)

## 2017-02-23 LAB — COMPREHENSIVE METABOLIC PANEL
ALBUMIN: 4.2 g/dL (ref 3.5–5.0)
ALK PHOS: 33 U/L — AB (ref 38–126)
ALT: 14 U/L (ref 14–54)
ANION GAP: 9 (ref 5–15)
AST: 18 U/L (ref 15–41)
BUN: 8 mg/dL (ref 6–20)
CO2: 22 mmol/L (ref 22–32)
Calcium: 9.7 mg/dL (ref 8.9–10.3)
Chloride: 105 mmol/L (ref 101–111)
Creatinine, Ser: 0.66 mg/dL (ref 0.44–1.00)
GFR calc Af Amer: >60 mL/min (ref >60)
GFR calc non Af Amer: >60 mL/min (ref >60)
GLUCOSE: 71 mg/dL (ref 65–99)
POTASSIUM: 3.9 mmol/L (ref 3.5–5.1)
SODIUM: 136 mmol/L (ref 135–145)
Total Bilirubin: 0.9 mg/dL (ref 0.3–1.2)
Total Protein: 7.6 g/dL (ref 6.5–8.1)

## 2017-02-23 LAB — CBC WITH DIFFERENTIAL/PLATELET
BASOS ABS: 0 10*3/uL (ref 0.0–0.1)
BASOS PCT: 0 %
EOS ABS: 0 10*3/uL (ref 0.0–0.7)
Eosinophils Relative: 0 %
HCT: 33.3 % — ABNORMAL LOW (ref 36.0–46.0)
Hemoglobin: 11.4 g/dL — ABNORMAL LOW (ref 12.0–15.0)
Lymphocytes Relative: 30 %
Lymphs Abs: 1.6 10*3/uL (ref 0.7–4.0)
MCH: 31.1 pg (ref 26.0–34.0)
MCHC: 34.2 g/dL (ref 30.0–36.0)
MCV: 91 fL (ref 78.0–100.0)
MONOS PCT: 3 %
Monocytes Absolute: 0.1 10*3/uL (ref 0.1–1.0)
NEUTROS ABS: 3.6 10*3/uL (ref 1.7–7.7)
NEUTROS PCT: 67 %
Platelets: 247 10*3/uL (ref 150–400)
RBC: 3.66 MIL/uL — ABNORMAL LOW (ref 3.87–5.11)
RDW: 13 % (ref 11.5–15.5)
WBC: 5.4 10*3/uL (ref 4.0–10.5)

## 2017-02-23 MED ORDER — ONDANSETRON 8 MG PO TBDP: 8.0000 mg | ORAL_TABLET | Freq: Three times a day (TID) | ORAL | 3 refills | Status: DC | PRN

## 2017-02-23 MED ORDER — DEXTROSE 5 % IN LACTATED RINGERS IV BOLUS
1000.0000 mL | Freq: Once | INTRAVENOUS | Status: AC
  Administered 2017-02-23: 1000 mL via INTRAVENOUS

## 2017-02-23 MED ORDER — LACTATED RINGERS IV SOLN
INTRAVENOUS | Status: DC
  Administered 2017-02-23: 10:00:00 via INTRAVENOUS

## 2017-02-23 MED ORDER — PROMETHAZINE HCL 25 MG RE SUPP: 25.0000 mg | Freq: Four times a day (QID) | RECTAL | 4 refills | Status: DC | PRN

## 2017-02-23 MED ORDER — ONDANSETRON HCL 4 MG/2ML IJ SOLN
4.0000 mg | Freq: Once | INTRAMUSCULAR | Status: AC
  Administered 2017-02-23: 4 mg via INTRAVENOUS
  Filled 2017-02-23: qty 2

## 2017-02-23 NOTE — MAU Note (Signed)
Pt presents to MAU with complaints of N/V. Denies any VB or abnormal discharge. PT was evaluated in MAU on June the 6th and given medications for nausea

## 2017-02-23 NOTE — MAU Note (Signed)
C/o N&V for past 2 months; states that she is losing weight ( approximately 18 lbs); c/o weakness throughout the pregnancy so far;

## 2017-02-23 NOTE — MAU Provider Note (Signed)
Chief Complaint: Nausea and Fatigue   First Provider Initiated Contact with Patient 02/23/17 0902     SUBJECTIVE HPI: Stephanie Choi is a 26 y.o. G1P0 at [redacted]w[redacted]d who presents to Maternity Admissions reporting continued nausea and vomiting and poor appetite despite using Phenergan tablets and suppositories, Unisom and B-6 for nausea and vomiting of pregnancy. 14 pound weight loss (6%) since first visit to maternity admissions 01/16/2017. Patient reports reduction of episodes of emesis to 2-3 per day, but still vomits almost every time she eats or drinks.  Duration: 2 months Context: Early pregnancy Modifying factors: Mild improvement with Phenergan, Unisom and B-6. Associated signs and symptoms: Negative for fever, chills, urinary complaints, diarrhea, sick contacts. Positive for decreased appetite, weakness, poor appetite, "feeling like something is in my throat".  Started prenatal care at [facility] Department.  Past Medical History:  Diagnosis Date  . Medical history non-contributory    OB History  Gravida Para Term Preterm AB Living  1            SAB TAB Ectopic Multiple Live Births               # Outcome Date GA Lbr Len/2nd Weight Sex Delivery Anes PTL Lv  1 Current              Past Surgical History:  Procedure Laterality Date  . NO PAST SURGERIES     Social History   Social History  . Marital status: Married    Spouse name: N/A  . Number of children: N/A  . Years of education: N/A   Occupational History  . Not on file.   Social History Main Topics  . Smoking status: Never Smoker  . Smokeless tobacco: Never Used  . Alcohol use No  . Drug use: No  . Sexual activity: Yes    Birth control/ protection: None   Other Topics Concern  . Not on file   Social History Narrative  . No narrative on file   History reviewed. No pertinent family history. No current facility-administered medications on file prior to encounter.    No current outpatient  prescriptions on file prior to encounter.   No Known Allergies  I have reviewed patient's Past Medical Hx, Surgical Hx, Family Hx, Social Hx, medications and allergies.   Review of Systems  Constitutional: Positive for appetite change and unexpected weight change. Negative for chills and fever.  Gastrointestinal: Positive for nausea and vomiting. Negative for abdominal pain, constipation and diarrhea.  Genitourinary: Negative for dysuria, flank pain, frequency, hematuria, urgency and vaginal bleeding.  Musculoskeletal: Negative for back pain.  Neurological: Positive for weakness.    OBJECTIVE Patient Vitals for the past 24 hrs:  BP Temp Temp src Pulse Resp Weight  02/23/17 0833 110/68 99.7 F (37.6 C) Oral 89 16 -  02/23/17 0817 129/71 98.7 F (37.1 C) - 97 18 212 lb (96.2 kg)   Constitutional: Well-developed, well-nourished, Overweight female in no acute distress.  Head: Mucous membranes dry. Cardiovascular: normal rate Respiratory: normal rate and effort.  GI: Abd soft, non-tender. Neurologic: Alert and oriented x 4.  GU: Fetal heart rate 162 by Doppler  LAB RESULTS Results for orders placed or performed during the hospital encounter of 02/23/17 (from the past 24 hour(s))  Urinalysis, Routine w reflex microscopic     Status: Abnormal   Collection Time: 02/23/17  8:17 AM  Result Value Ref Range   Color, Urine YELLOW YELLOW   APPearance CLOUDY (A) CLEAR  Specific Gravity, Urine 1.030 1.005 - 1.030   pH 5.0 5.0 - 8.0   Glucose, UA NEGATIVE NEGATIVE mg/dL   Hgb urine dipstick MODERATE (A) NEGATIVE   Bilirubin Urine NEGATIVE NEGATIVE   Ketones, ur 80 (A) NEGATIVE mg/dL   Protein, ur 161 (A) NEGATIVE mg/dL   Nitrite NEGATIVE NEGATIVE   Leukocytes, UA SMALL (A) NEGATIVE   RBC / HPF TOO NUMEROUS TO COUNT 0 - 5 RBC/hpf   WBC, UA TOO NUMEROUS TO COUNT 0 - 5 WBC/hpf   Bacteria, UA RARE (A) NONE SEEN   Squamous Epithelial / LPF 0-5 (A) NONE SEEN   Mucous PRESENT   CBC with  Differential/Platelet     Status: Abnormal   Collection Time: 02/23/17  9:20 AM  Result Value Ref Range   WBC 5.4 4.0 - 10.5 K/uL   RBC 3.66 (L) 3.87 - 5.11 MIL/uL   Hemoglobin 11.4 (L) 12.0 - 15.0 g/dL   HCT 09.6 (L) 04.5 - 40.9 %   MCV 91.0 78.0 - 100.0 fL   MCH 31.1 26.0 - 34.0 pg   MCHC 34.2 30.0 - 36.0 g/dL   RDW 81.1 91.4 - 78.2 %   Platelets 247 150 - 400 K/uL   Neutrophils Relative % 67 %   Neutro Abs 3.6 1.7 - 7.7 K/uL   Lymphocytes Relative 30 %   Lymphs Abs 1.6 0.7 - 4.0 K/uL   Monocytes Relative 3 %   Monocytes Absolute 0.1 0.1 - 1.0 K/uL   Eosinophils Relative 0 %   Eosinophils Absolute 0.0 0.0 - 0.7 K/uL   Basophils Relative 0 %   Basophils Absolute 0.0 0.0 - 0.1 K/uL  Comprehensive metabolic panel     Status: Abnormal   Collection Time: 02/23/17  9:20 AM  Result Value Ref Range   Sodium 136 135 - 145 mmol/L   Potassium 3.9 3.5 - 5.1 mmol/L   Chloride 105 101 - 111 mmol/L   CO2 22 22 - 32 mmol/L   Glucose, Bld 71 65 - 99 mg/dL   BUN 8 6 - 20 mg/dL   Creatinine, Ser 9.56 0.44 - 1.00 mg/dL   Calcium 9.7 8.9 - 21.3 mg/dL   Total Protein 7.6 6.5 - 8.1 g/dL   Albumin 4.2 3.5 - 5.0 g/dL   AST 18 15 - 41 U/L   ALT 14 14 - 54 U/L   Alkaline Phosphatase 33 (L) 38 - 126 U/L   Total Bilirubin 0.9 0.3 - 1.2 mg/dL   GFR calc non Af Amer >60 >60 mL/min   GFR calc Af Amer >60 >60 mL/min   Anion gap 9 5 - 15    IMAGING No results found.  MAU COURSE Orders Placed This Encounter  Procedures  . Culture, OB Urine  . Urinalysis, Routine w reflex microscopic  . CBC with Differential/Platelet  . Comprehensive metabolic panel  . Discharge patient   Meds ordered this encounter  Medications  . dextrose 5% lactated ringers bolus 1,000 mL  . ondansetron (ZOFRAN) injection 4 mg  . lactated ringers infusion  . Prenatal Vit-Fe Phos-FA-Omega (VITAFOL GUMMIES) 3.33-0.333-34.8 MG CHEW    Sig: Chew 1 each by mouth 3 (three) times daily.  . promethazine (PHENERGAN) 25 MG  suppository    Sig: Place 1 suppository (25 mg total) rectally every 6 (six) hours as needed for nausea or vomiting.    Dispense:  30 suppository    Refill:  4    unable to keep down promethazine tabs  Order Specific Question:   Supervising Provider    Answer:   Willodean RosenthalHARRAWAY-Webber Michiels, CAROLYN [[id number]]  . ondansetron (ZOFRAN ODT) 8 MG disintegrating tablet    Sig: Take 1 tablet (8 mg total) by mouth every 8 (eight) hours as needed for nausea or vomiting.    Dispense:  20 tablet    Refill:  3    Order Specific Question:   Supervising Provider    Answer:   Willodean RosenthalHARRAWAY-Marselino Slayton, CAROLYN [[id number]]   Tolerating ginger ale and crackers well after Zofran.. Feeling much better after IV fluids.  MDM - Hyperemesis controlled with Zofran. Rx Zofran. Constipation precautions. Suggested alternating  Phenergan at night and Zofran in the day due to side effects of each.  ASSESSMENT 1. Hyperemesis affecting pregnancy, antepartum     PLAN Discharge home in stable condition. Hyperemesis precautions Follow-up Information    [facility].   Why:  As scheduled for prenatal visit Contact information: [address] [place] [phone]        [facility] Follow up.   Why:  In emergencies Contact information: [address] [id number] [place] [id number] [phone]         Allergies as of 02/23/2017   No Known Allergies     Medication List    TAKE these medications   ondansetron 8 MG disintegrating tablet Commonly known as:  ZOFRAN ODT Take 1 tablet (8 mg total) by mouth every 8 (eight) hours as needed for nausea or vomiting.   promethazine 25 MG suppository Commonly known as:  PHENERGAN Place 1 suppository (25 mg total) rectally every 6 (six) hours as needed for nausea or vomiting. What changed:  reasons to take this   VITAFOL GUMMIES 3.33-0.333-34.8 MG Chew Chew  1 each by mouth 3 (three) times daily.        Katrinka BlazingSmith, [place], CNM 02/23/2017  11:09 AM

## 2017-02-23 NOTE — Discharge Instructions (Signed)
Hyperemesis Gravidarum °Hyperemesis gravidarum is a severe form of nausea and vomiting that happens during pregnancy. Hyperemesis is worse than morning sickness. It may cause you to have nausea or vomiting all day for many days. It may keep you from eating and drinking enough food and liquids. Hyperemesis usually occurs during the first half (the first 20 weeks) of pregnancy. It often goes away once a woman is in her second half of pregnancy. However, sometimes hyperemesis continues through an entire pregnancy. °What are the causes? °The cause of this condition is not known. It may be related to changes in chemicals (hormones) in the body during pregnancy, such as the high level of pregnancy hormone (human chorionic gonadotropin) or the increase in the female sex hormone (estrogen). °What are the signs or symptoms? °Symptoms of this condition include: °· Severe nausea and vomiting. °· Nausea that does not go away. °· Vomiting that does not allow you to keep any food down. °· Weight loss. °· Body fluid loss (dehydration). °· Having no desire to eat, or not liking food that you have previously enjoyed. ° °How is this diagnosed? °This condition may be diagnosed based on: °· A physical exam. °· Your medical history. °· Your symptoms. °· Blood tests. °· Urine tests. ° °How is this treated? °This condition may be managed with medicine. If medicines to do not help relieve nausea and vomiting, you may need to receive fluids through an IV tube at the hospital. °Follow these instructions at home: °· Take over-the-counter and prescription medicines only as told by your health care provider. °· Avoid iron pills and multivitamins that contain iron for the first 3-4 months of pregnancy. If you take prescription iron pills, do not stop taking them unless your health care provider approves. °· Take the following actions to help prevent nausea and vomiting: °? In the morning, before getting out of bed, try eating a couple of dry  crackers or a piece of toast. °? Avoid foods and smells that upset your stomach. Fatty and spicy foods may make nausea worse. °? Eat 5-6 small meals a day. °? Do not drink fluids while eating meals. Drink between meals. °? Eat or suck on things that have ginger in them. Ginger can help relieve nausea. °? Avoid food preparation. The smell of food can spoil your appetite or trigger nausea. °· Follow instructions from your health care provider about eating or drinking restrictions. °· For snacks, eat high-protein foods, such as cheese. °· Keep all follow-up and pre-birth (prenatal) visits as told by your health care provider. This is important. °Contact a health care provider if: °· You have pain in your abdomen. °· You have a severe headache. °· You have vision problems. °· You are losing weight. °Get help right away if: °· You cannot drink fluids without vomiting. °· You vomit blood. °· You have constant nausea and vomiting. °· You are very weak. °· You are very thirsty. °· You feel dizzy. °· You faint. °· You have a fever or other symptoms that last for more than 2-3 days. °· You have a fever and your symptoms suddenly get worse. °Summary °· Hyperemesis gravidarum is a severe form of nausea and vomiting that happens during pregnancy. °· Making some changes to your eating habits may help relieve nausea and vomiting. °· This condition may be managed with medicine. °· If medicines to do not help relieve nausea and vomiting, you may need to receive fluids through an IV tube at the hospital. °This   information is not intended to replace advice given to you by your health care provider. Make sure you discuss any questions you have with your health care provider. °Document Released: 08/12/2005 Document Revised: 04/10/2016 Document Reviewed: 04/10/2016 °Elsevier Interactive Patient Education © 2017 [employer] Inc[employer] °Eating Plan for Hyperemesis Gravidarum °Hyperemesis gravidarum is a severe form of morning sickness. Because this  condition causes severe nausea and vomiting, it can lead to dehydration, malnutrition, and weight loss. One way to lessen the symptoms of nausea and vomiting is to follow the eating plan for hyperemesis gravidarum. It is often used along with prescribed medicines to control your symptoms. °What can I do to relieve my symptoms? °Listen to your body. Everyone is different and has different preferences. Find what works best for you. Take any of the following actions that are helpful to you: °· Eat and drink slowly. °· Eat 5-6 small meals daily instead of 3 large meals. °· Eat crackers before you get out of bed in the morning. °· Try having a snack in the middle of the night. °· Starchy foods are usually tolerated well. Examples include cereal, toast, bread, potatoes, pasta, rice, and pretzels. °· Ginger may help with nausea. Add ¼ tsp ground ginger to hot tea or choose ginger tea. °· Try drinking 100% fruit juice or an electrolyte drink. An electrolyte drink contains sodium, potassium, and chloride. °· Continue to take your prenatal vitamins as told by your health care provider. If you are having trouble taking your prenatal vitamins, talk with your health care provider about different options. °· Include at least 1 serving of protein with your meals and snacks. Protein options include meats or poultry, beans, nuts, eggs, and yogurt. Try eating a protein-rich snack before bed. Examples of these snacks include cheese and crackers or half of a peanut butter or turkey sandwich. °· Consider eliminating foods that trigger your symptoms. These may include spicy foods, coffee, high-fat foods, very sweet foods, and acidic foods. °· Try meals that have more protein combined with bland, salty, lower-fat, and dry foods, such as nuts, seeds, pretzels, crackers, and cereal. °· Talk with your healthcare provider about starting a supplement of vitamin B6. °· Have fluids that are cold, clear, and carbonated or sour. Examples include  lemonade, ginger ale, lemon-lime soda, ice water, and sparkling water. °· Try lemon or mint tea. °· Try brushing your teeth or using a mouth rinse after meals. ° °What should I avoid to reduce my symptoms? °Avoiding some of the following things may help reduce your symptoms. °· Foods with strong smells. Try eating meals in well-ventilated areas that are free of odors. °· Drinking water or other beverages with meals. Try not to drink anything during the 30 minutes before and after your meals. °· Drinking more than 1 cup of fluid at a time. Sometimes using a straw helps. °· Fried or high-fat foods, such as butter and cream sauces. °· Spicy foods. °· Skipping meals as best as you can. Nausea can be more intense on an empty stomach. If you cannot tolerate food at that time, do not force it. Try sucking on ice chips or other frozen items, and make up for missed calories later. °· Lying down within 2 hours after eating. °· Environmental triggers. These may include smoky rooms, closed spaces, rooms with strong smells, warm or humid places, overly loud and noisy rooms, and rooms with motion or flickering lights. °· Quick and sudden changes in your movement. ° °This information is not   intended to replace advice given to you by your health care provider. Make sure you discuss any questions you have with your health care provider. °Document Released: 06/09/2007 Document Revised: 04/10/2016 Document Reviewed: 03/12/2016 °Elsevier Interactive Patient Education © 2018 Elsevier Inc. ° °

## 2017-02-25 ENCOUNTER — Other Ambulatory Visit (HOSPITAL_COMMUNITY): Payer: Self-pay | Admitting: Nurse Practitioner

## 2017-02-25 ENCOUNTER — Ambulatory Visit (HOSPITAL_COMMUNITY)
Admission: RE | Admit: 2017-02-25 | Discharge: 2017-02-25 | Disposition: A | Discharge status: Home / Self Care | Payer: Medicaid Other | Source: Ambulatory Visit | Attending: Nurse Practitioner | Admitting: Nurse Practitioner

## 2017-02-25 ENCOUNTER — Encounter (HOSPITAL_COMMUNITY): Payer: Self-pay

## 2017-02-25 DIAGNOSIS — O99212 Obesity complicating pregnancy, second trimester: Secondary | ICD-10-CM

## 2017-02-25 DIAGNOSIS — Z3682 Encounter for antenatal screening for nuchal translucency: Secondary | ICD-10-CM | POA: Insufficient documentation

## 2017-02-25 DIAGNOSIS — Z3A18 18 weeks gestation of pregnancy: Secondary | ICD-10-CM | POA: Insufficient documentation

## 2017-02-25 DIAGNOSIS — Z3A12 12 weeks gestation of pregnancy: Secondary | ICD-10-CM

## 2017-02-25 LAB — CULTURE, OB URINE: Culture: NO GROWTH

## 2017-03-03 ENCOUNTER — Encounter: Payer: Self-pay | Admitting: Advanced Practice Midwife

## 2017-03-03 MED ORDER — PROMETHAZINE HCL 25 MG RE SUPP: 25.0000 mg | Freq: Four times a day (QID) | RECTAL | 2 refills | Status: DC | PRN

## 2017-03-12 ENCOUNTER — Other Ambulatory Visit (HOSPITAL_COMMUNITY): Payer: Self-pay

## 2017-03-19 ENCOUNTER — Inpatient Hospital Stay (HOSPITAL_COMMUNITY)
Admission: AD | Admit: 2017-03-19 | Discharge: 2017-03-19 | Disposition: A | Discharge status: Home / Self Care | Payer: Medicaid Other | Source: Ambulatory Visit | Attending: Obstetrics & Gynecology | Admitting: Obstetrics & Gynecology

## 2017-03-19 ENCOUNTER — Encounter (HOSPITAL_COMMUNITY): Payer: Self-pay

## 2017-03-19 DIAGNOSIS — R531 Weakness: Secondary | ICD-10-CM | POA: Diagnosis present

## 2017-03-19 DIAGNOSIS — O26812 Pregnancy related exhaustion and fatigue, second trimester: Secondary | ICD-10-CM | POA: Diagnosis not present

## 2017-03-19 DIAGNOSIS — R51 Headache: Secondary | ICD-10-CM | POA: Insufficient documentation

## 2017-03-19 DIAGNOSIS — O210 Mild hyperemesis gravidarum: Secondary | ICD-10-CM

## 2017-03-19 DIAGNOSIS — Z3A15 15 weeks gestation of pregnancy: Secondary | ICD-10-CM

## 2017-03-19 DIAGNOSIS — O26892 Other specified pregnancy related conditions, second trimester: Secondary | ICD-10-CM | POA: Insufficient documentation

## 2017-03-19 LAB — URINALYSIS, ROUTINE W REFLEX MICROSCOPIC
BILIRUBIN URINE: NEGATIVE
Glucose, UA: NEGATIVE mg/dL
Hgb urine dipstick: NEGATIVE
KETONES UR: NEGATIVE mg/dL
Nitrite: NEGATIVE
PH: 6 (ref 5.0–8.0)
PROTEIN: NEGATIVE mg/dL
Specific Gravity, Urine: 1.015 (ref 1.005–1.030)

## 2017-03-19 LAB — COMPREHENSIVE METABOLIC PANEL
ALBUMIN: 3.7 g/dL (ref 3.5–5.0)
ALT: 15 U/L (ref 14–54)
ANION GAP: 8 (ref 5–15)
AST: 17 U/L (ref 15–41)
Alkaline Phosphatase: 34 U/L — ABNORMAL LOW (ref 38–126)
BUN: 6 mg/dL (ref 6–20)
CHLORIDE: 103 mmol/L (ref 101–111)
CO2: 23 mmol/L (ref 22–32)
Calcium: 9.2 mg/dL (ref 8.9–10.3)
Creatinine, Ser: 0.61 mg/dL (ref 0.44–1.00)
GFR calc non Af Amer: >60 mL/min (ref >60)
GLUCOSE: 84 mg/dL (ref 65–99)
Potassium: 3.6 mmol/L (ref 3.5–5.1)
SODIUM: 134 mmol/L — AB (ref 135–145)
Total Bilirubin: 0.4 mg/dL (ref 0.3–1.2)
Total Protein: 7.3 g/dL (ref 6.5–8.1)

## 2017-03-19 LAB — CBC
HCT: 29.8 % — ABNORMAL LOW (ref 36.0–46.0)
HEMOGLOBIN: 10.2 g/dL — AB (ref 12.0–15.0)
MCH: 31.5 pg (ref 26.0–34.0)
MCHC: 34.2 g/dL (ref 30.0–36.0)
MCV: 92 fL (ref 78.0–100.0)
PLATELETS: 231 10*3/uL (ref 150–400)
RBC: 3.24 MIL/uL — AB (ref 3.87–5.11)
RDW: 12.8 % (ref 11.5–15.5)
WBC: 6 10*3/uL (ref 4.0–10.5)

## 2017-03-19 MED ORDER — ACETAMINOPHEN 500 MG PO TABS
1000.0000 mg | ORAL_TABLET | Freq: Once | ORAL | Status: AC
  Administered 2017-03-19: 1000 mg via ORAL
  Filled 2017-03-19: qty 2

## 2017-03-19 NOTE — MAU Note (Addendum)
Pt is here with c/o body aches, headache, weakness, and back pain. Pain comes and goes. Denies any bleeding or leaking.

## 2017-03-19 NOTE — MAU Provider Note (Signed)
History     CSN: [id number]  Arrival date and time: 03/19/17 [id number]   First Provider Initiated Contact with Patient 03/19/17 2106     Chief Complaint  Patient presents with  . Back Pain  . Weakness  . Generalized Body Aches   HPI Stephanie Choi is a 26 y.o. G1P0 at [id number] who presents complaining of weakness, headache and intermittent back pain. She states the back pain has been going on for 2 weeks and is worse when she sits up. She rates it a 6/10 and has not tried anything for the pain. She states she has not drank any water today and ate less than normal because she has no appetite. Patient still reports nausea and vomiting but it is getting better. No vomiting today. She denies any vaginal bleeding or discharge.   OB History    Gravida Para Term Preterm AB Living   1             SAB TAB Ectopic Multiple Live Births                  Past Medical History:  Diagnosis Date  . Medical history non-contributory     Past Surgical History:  Procedure Laterality Date  . NO PAST SURGERIES      No family history on file.  Social History  Substance Use Topics  . Smoking status: Never Smoker  . Smokeless tobacco: Never Used  . Alcohol use No    Allergies: No Known Allergies  Prescriptions Prior to Admission  Medication Sig Dispense Refill Last Dose  . ondansetron (ZOFRAN ODT) 8 MG disintegrating tablet Take 1 tablet (8 mg total) by mouth every 8 (eight) hours as needed for nausea or vomiting. 20 tablet 3 03/19/2017 at Unknown time  . Prenatal Vit-Fe Phos-FA-Omega (VITAFOL GUMMIES) 3.33-0.333-34.8 MG CHEW Chew 1 each by mouth 3 (three) times daily.   Past Week at Unknown time  . promethazine (PHENERGAN) 25 MG suppository Place 1 suppository (25 mg total) rectally every 6 (six) hours as needed for nausea or vomiting. 30 suppository 4 Taking  . promethazine (PHENERGAN) 25 MG suppository Place 1 suppository (25 mg total) rectally every 6 (six) hours as needed for nausea or  vomiting. 20 suppository 2     Review of Systems  Constitutional: Positive for appetite change. Negative for chills and fever.  HENT: Negative.   Respiratory: Negative.  Negative for shortness of breath.   Cardiovascular: Negative.  Negative for chest pain.  Gastrointestinal: Negative.  Negative for abdominal pain, constipation, diarrhea, nausea and vomiting.  Genitourinary: Negative.  Negative for dysuria, frequency, urgency, vaginal bleeding and vaginal discharge.  Musculoskeletal: Positive for back pain.  Neurological: Positive for weakness and headaches. Negative for dizziness.  Psychiatric/Behavioral: Negative.    Physical Exam   Blood pressure (!) 107/49, pulse 80, temperature 98.7 F (37.1 C), temperature source Oral, resp. rate 17, height 5\' 5"  (1.651 m), weight 215 lb (97.5 kg), last menstrual period 11/28/2016, SpO2 100 %.  Orthostatic VS for the past 24 hrs (Last 3 readings):  BP- Lying Pulse- Lying BP- Sitting Pulse- Sitting BP- Standing at 0 minutes Pulse- Standing at 0 minutes BP- Standing at 3 minutes Pulse- Standing at 3 minutes  03/19/17 2216 107/49 80 125/68 79 (!) 136/38 91 138/66 93    Physical Exam  Nursing note and vitals reviewed. Constitutional: She is oriented to person, place, and time. She appears well-developed and well-nourished.  HENT:  Head: Normocephalic and atraumatic.  Eyes:  Conjunctivae are normal. No scleral icterus.  Cardiovascular: Normal rate, regular rhythm and normal heart sounds.   Respiratory: Effort normal and breath sounds normal. No respiratory distress.  GI: Soft. She exhibits no distension. There is no tenderness. There is no guarding.  Neurological: She is alert and oriented to person, place, and time.  Skin: Skin is warm and dry.  Psychiatric: She has a normal mood and affect. Her behavior is normal. Judgment and thought content normal.   FHT: 159 bpm  MAU Course  Procedures Results for orders placed or performed during the  hospital encounter of 03/19/17 (from the past 24 hour(s))  Urinalysis, Routine w reflex microscopic     Status: Abnormal   Collection Time: 03/19/17  8:53 PM  Result Value Ref Range   Color, Urine YELLOW YELLOW   APPearance HAZY (A) CLEAR   Specific Gravity, Urine 1.015 1.005 - 1.030   pH 6.0 5.0 - 8.0   Glucose, UA NEGATIVE NEGATIVE mg/dL   Hgb urine dipstick NEGATIVE NEGATIVE   Bilirubin Urine NEGATIVE NEGATIVE   Ketones, ur NEGATIVE NEGATIVE mg/dL   Protein, ur NEGATIVE NEGATIVE mg/dL   Nitrite NEGATIVE NEGATIVE   Leukocytes, UA MODERATE (A) NEGATIVE   RBC / HPF 0-5 0 - 5 RBC/hpf   WBC, UA 0-5 0 - 5 WBC/hpf   Bacteria, UA MANY (A) NONE SEEN   Squamous Epithelial / LPF 0-5 (A) NONE SEEN   Mucous PRESENT   CBC     Status: Abnormal   Collection Time: 03/19/17  9:34 PM  Result Value Ref Range   WBC 6.0 4.0 - 10.5 K/uL   RBC 3.24 (L) 3.87 - 5.11 MIL/uL   Hemoglobin 10.2 (L) 12.0 - 15.0 g/dL   HCT 16.1 (L) 09.6 - 04.5 %   MCV 92.0 78.0 - 100.0 fL   MCH 31.5 26.0 - 34.0 pg   MCHC 34.2 30.0 - 36.0 g/dL   RDW 40.9 81.1 - 91.4 %   Platelets 231 150 - 400 K/uL  Comprehensive metabolic panel     Status: Abnormal   Collection Time: 03/19/17  9:34 PM  Result Value Ref Range   Sodium 134 (L) 135 - 145 mmol/L   Potassium 3.6 3.5 - 5.1 mmol/L   Chloride 103 101 - 111 mmol/L   CO2 23 22 - 32 mmol/L   Glucose, Bld 84 65 - 99 mg/dL   BUN 6 6 - 20 mg/dL   Creatinine, Ser 7.82 0.44 - 1.00 mg/dL   Calcium 9.2 8.9 - 95.6 mg/dL   Total Protein 7.3 6.5 - 8.1 g/dL   Albumin 3.7 3.5 - 5.0 g/dL   AST 17 15 - 41 U/L   ALT 15 14 - 54 U/L   Alkaline Phosphatase 34 (L) 38 - 126 U/L   Total Bilirubin 0.4 0.3 - 1.2 mg/dL   GFR calc non Af Amer >60 >60 mL/min   GFR calc Af Amer >60 >60 mL/min   Anion gap 8 5 - 15   MDM UA CBC, CMP PO hydration Tylenol 1000mg  PO- patient reports relief  Assessment and Plan   1. Pregnancy headache in second trimester   2. Hyperemesis affecting pregnancy,  antepartum   3. [redacted] weeks gestation of pregnancy   4. Fatigue during pregnancy in second trimester    -Discharge patient home in stable condition -Encouraged patient to eat many small snack and drink 8-10 glasses of water  -Continue nausea medication PRN -Tylenol for headaches prn -Follow up at [facility]  as scheduled for prenatal care -Encouraged to return here or to other Urgent Care/ED if she develops worsening of symptoms, increase in pain, fever, or other concerning symptoms.   Cleone SlimCaroline Neill SNM 03/19/2017, 10:25 PM   I confirm that I have verified the information documented in the nurse midwife student's note and that I have also personally reperformed the physical exam and all medical decision making activities.   Thressa ShellerHeather Hogan 12:22 AM 03/20/17

## 2017-03-19 NOTE — Discharge Instructions (Signed)
General Headache   A headache is pain or discomfort felt around the head or neck area. The specific cause of a headache may not be found. There are many causes and types of headaches. A few common ones are:  Tension headaches.  Migraine headaches.  Cluster headaches.  Chronic daily headaches.  Follow these instructions at home: Watch your condition for any changes. Take these steps to help with your condition: Managing pain  Take over-the-counter and prescription medicines only as told by your health care provider.  Lie down in a dark, quiet room when you have a headache.  If directed, apply ice to the head and neck area: ? Put ice in a plastic bag. ? Place a towel between your skin and the bag. ? Leave the ice on for 20 minutes, 2-3 times per day.  Use a heating pad or hot shower to apply heat to the head and neck area as told by your health care provider.  Keep lights dim if bright lights bother you or make your headaches worse. Eating and drinking  Eat meals on a regular schedule.  Limit alcohol use.  Decrease the amount of caffeine you drink, or stop drinking caffeine. General instructions  Keep all follow-up visits as told by your health care provider. This is important.  Keep a headache journal to help find out what may trigger your headaches. For example, write down: ? What you eat and drink. ? How much sleep you get. ? Any change to your diet or medicines.  Try massage or other relaxation techniques.  Limit stress.  Sit up straight, and do not tense your muscles.  Do not use tobacco products, including cigarettes, chewing tobacco, or e-cigarettes. If you need help quitting, ask your health care provider.  Exercise regularly as told by your health care provider.  Sleep on a regular schedule. Get 7-9 hours of sleep, or the amount recommended by your health care provider. Contact a health care provider if:  Your symptoms are not helped by medicine.  You  have a headache that is different from the usual headache.  You have nausea or you vomit.  You have a fever. Get help right away if:  Your headache becomes severe.  You have repeated vomiting.  You have a stiff neck.  You have a loss of vision.  You have problems with speech.  You have pain in the eye or ear.  You have muscular weakness or loss of muscle control.  You lose your balance or have trouble walking.  You feel faint or pass out.  You have confusion. This information is not intended to replace advice given to you by your health care provider. Make sure you discuss any questions you have with your health care provider. Document Released: 08/12/2005 Document Revised: 01/18/2016 Document Reviewed: 12/05/2014 Elsevier Interactive Patient Education  2017 [employer] Eating Plan for Pregnant Women While you are pregnant, your body will require additional nutrition to help support your growing baby. It is recommended that you consume:  150 additional calories each day during your first trimester.  300 additional calories each day during your second trimester.  300 additional calories each day during your third trimester.  Eating a healthy, well-balanced diet is very important for your health and for your baby's health. You also have a higher need for some vitamins and minerals, such as folic acid, calcium, iron, and vitamin D. What do I need to know about eating during pregnancy?  Do not try to  lose weight or go on a diet during pregnancy.  Choose healthy, nutritious foods. Choose  of a sandwich with a glass of milk instead of a candy bar or a high-calorie sugar-sweetened beverage.  Limit your overall intake of foods that have "empty calories." These are foods that have little nutritional value, such as sweets, desserts, candies, sugar-sweetened beverages, and fried foods.  Eat a variety of foods, especially fruits and vegetables.  Take a prenatal vitamin to  help meet the additional needs during pregnancy, specifically for folic acid, iron, calcium, and vitamin D.  Remember to stay active. Ask your health care provider for exercise recommendations that are specific to you.  Practice good food safety and cleanliness, such as washing your hands before you eat and after you prepare raw meat. This helps to prevent foodborne illnesses, such as listeriosis, that can be very dangerous for your baby. Ask your health care provider for more information about listeriosis. What does 150 extra calories look like? Healthy options for an additional 150 calories each day could be any of the following:  Plain low-fat yogurt (6-8 oz) with  cup of berries.  1 apple with 2 teaspoons of peanut butter.  Cut-up vegetables with  cup of hummus.  Low-fat chocolate milk (8 oz or 1 cup).  1 string cheese with 1 medium orange.   of a peanut butter and jelly sandwich on whole-wheat bread (1 tsp of peanut butter).  For 300 calories, you could eat two of those healthy options each day. What is a healthy amount of weight to gain? The recommended amount of weight for you to gain is based on your pre-pregnancy BMI. If your pre-pregnancy BMI was:  Less than 18 (underweight), you should gain 28-40 lb.  18-24.9 (normal), you should gain 25-35 lb.  25-29.9 (overweight), you should gain 15-25 lb.  Greater than 30 (obese), you should gain 11-20 lb.  What if I am having twins or multiples? Generally, pregnant women who will be having twins or multiples may need to increase their daily calories by 300-600 calories each day. The recommended range for total weight gain is 25-54 lb, depending on your pre-pregnancy BMI. Talk with your health care provider for specific guidance about additional nutritional needs, weight gain, and exercise during your pregnancy. What foods can I eat? Grains Any grains. Try to choose whole grains, such as whole-wheat bread, oatmeal, or brown  rice. Vegetables Any vegetables. Try to eat a variety of colors and types of vegetables to get a full range of vitamins and minerals. Remember to wash your vegetables well before eating. Fruits Any fruits. Try to eat a variety of colors and types of fruit to get a full range of vitamins and minerals. Remember to wash your fruits well before eating. Meats and Other Protein Sources Lean meats, including chicken, [place], fish, and lean cuts of beef, veal, or pork. Make sure that all meats are cooked to "well done." Tofu. Tempeh. Beans. Eggs. Peanut butter and other nut butters. Seafood, such as shrimp, crab, and lobster. If you choose fish, select types that are higher in omega-3 fatty acids, including salmon, herring, mussels, trout, sardines, and pollock. Make sure that all meats are cooked to food-safe temperatures. Dairy Pasteurized milk and milk alternatives. Pasteurized yogurt and pasteurized cheese. Cottage cheese. Sour cream. Beverages Water. Juices that contain 100% fruit juice or vegetable juice. Caffeine-free teas and decaffeinated coffee. Drinks that contain caffeine are okay to drink, but it is better to avoid caffeine. Keep your  total caffeine intake to less than 200 mg each day (12 oz of coffee, tea, or soda) or as directed by your health care provider. Condiments Any pasteurized condiments. Sweets and Desserts Any sweets and desserts. Fats and Oils Any fats and oils. The items listed above may not be a complete list of recommended foods or beverages. Contact your dietitian for more options. What foods are not recommended? Vegetables Unpasteurized (raw) vegetable juices. Fruits Unpasteurized (raw) fruit juices. Meats and Other Protein Sources Cured meats that have nitrates, such as bacon, salami, and hotdogs. Luncheon meats, bologna, or other deli meats (unless they are reheated until they are steaming hot). Refrigerated pate, meat spreads from a meat counter, smoked seafood that  is found in the refrigerated section of a store. Raw fish, such as sushi or sashimi. High mercury content fish, such as tilefish, shark, swordfish, and king mackerel. Raw meats, such as tuna or beef tartare. Undercooked meats and poultry. Make sure that all meats are cooked to food-safe temperatures. Dairy Unpasteurized (raw) milk and any foods that have raw milk in them. Soft cheeses, such as feta, queso blanco, queso fresco, Brie, Camembert cheeses, blue-veined cheeses, and Panela cheese (unless it is made with pasteurized milk, which must be stated on the label). Beverages Alcohol. Sugar-sweetened beverages, such as sodas, teas, or energy drinks. Condiments Homemade fermented foods and drinks, such as pickles, sauerkraut, or kombucha drinks. (Store-bought pasteurized versions of these are okay.) Other Salads that are made in the store, such as ham salad, chicken salad, egg salad, tuna salad, and seafood salad. The items listed above may not be a complete list of foods and beverages to avoid. Contact your dietitian for more information. This information is not intended to replace advice given to you by your health care provider. Make sure you discuss any questions you have with your health care provider. Document Released: 05/27/2014 Document Revised: 01/18/2016 Document Reviewed: 01/25/2014 Elsevier Interactive Patient Education  [employer].

## 2017-03-21 LAB — CULTURE, OB URINE: Culture: NO GROWTH

## 2017-04-18 ENCOUNTER — Encounter (HOSPITAL_COMMUNITY): Payer: Self-pay | Admitting: *Deleted

## 2017-04-18 ENCOUNTER — Inpatient Hospital Stay (HOSPITAL_COMMUNITY)
Admission: AD | Admit: 2017-04-18 | Discharge: 2017-04-19 | Disposition: A | Discharge status: Home / Self Care | Payer: Medicaid Other | Source: Ambulatory Visit | Attending: Obstetrics and Gynecology | Admitting: Obstetrics and Gynecology

## 2017-04-18 DIAGNOSIS — K219 Gastro-esophageal reflux disease without esophagitis: Secondary | ICD-10-CM | POA: Diagnosis not present

## 2017-04-18 DIAGNOSIS — O210 Mild hyperemesis gravidarum: Secondary | ICD-10-CM | POA: Diagnosis not present

## 2017-04-18 DIAGNOSIS — O99612 Diseases of the digestive system complicating pregnancy, second trimester: Secondary | ICD-10-CM | POA: Diagnosis not present

## 2017-04-18 DIAGNOSIS — O99512 Diseases of the respiratory system complicating pregnancy, second trimester: Secondary | ICD-10-CM | POA: Diagnosis not present

## 2017-04-18 DIAGNOSIS — Z79899 Other long term (current) drug therapy: Secondary | ICD-10-CM | POA: Insufficient documentation

## 2017-04-18 DIAGNOSIS — K2100 Gastro-esophageal reflux disease with esophagitis, without bleeding: Secondary | ICD-10-CM

## 2017-04-18 DIAGNOSIS — Z3A20 20 weeks gestation of pregnancy: Secondary | ICD-10-CM | POA: Insufficient documentation

## 2017-04-18 DIAGNOSIS — R070 Pain in throat: Secondary | ICD-10-CM

## 2017-04-18 DIAGNOSIS — O9989 Other specified diseases and conditions complicating pregnancy, childbirth and the puerperium: Secondary | ICD-10-CM | POA: Diagnosis not present

## 2017-04-18 MED ORDER — GI COCKTAIL ~~LOC~~
30.0000 mL | Freq: Once | ORAL | Status: AC
  Administered 2017-04-18: 30 mL via ORAL
  Filled 2017-04-18: qty 30

## 2017-04-18 NOTE — MAU Provider Note (Signed)
Chief Complaint:  No chief complaint on file.   First Provider Initiated Contact with Patient 04/18/17 2330     HPI: Stephanie Choi is a 26 y.o. G1P0 at 44w1dwho presents to maternity admissions reporting throat burning and acid reflux since yesterday.  Has not had much vomiting recently . States acid comes up when she burps.  Has not taken anything for this..  Did have this complaint (of something feeling stuck in her throat) in July.  She denies LOF, vaginal bleeding, vaginal itching/burning, urinary symptoms, h/a, dizziness, n/v, diarrhea, constipation or fever/chills.   Has a history of hyperemesis  Other  This is a recurrent problem. The current episode started today. The problem occurs constantly. The problem has been unchanged. Pertinent negatives include no abdominal pain, anorexia, chest pain, chills, congestion, coughing, fever, headaches, myalgias, nausea or vomiting. The symptoms are aggravated by eating and drinking. She has tried nothing for the symptoms.  No sick contacts.  States does not think it is a "sore throat".    RN Note: Pt states that since yesterday afternoon she has a feeling that something is stuck in throat. States she drank ginger tea but now she has a lot of burning in throat and chest. Pt denies vomiting. Does not take any medications for acid reflux or heartburn. Pt denies abdominal pain, vaginal bleeding or LOF.   Past Medical History: Past Medical History:  Diagnosis Date  . Medical history non-contributory     Past obstetric history: OB History  Gravida Para Term Preterm AB Living  1            SAB TAB Ectopic Multiple Live Births               # Outcome Date GA Lbr Len/2nd Weight Sex Delivery Anes PTL Lv  1 Current               Past Surgical History: Past Surgical History:  Procedure Laterality Date  . NO PAST SURGERIES      Family History: History reviewed. No pertinent family history.  Social History: Social History  Substance Use  Topics  . Smoking status: Never Smoker  . Smokeless tobacco: Never Used  . Alcohol use No    Allergies: No Known Allergies  Meds:  Prescriptions Prior to Admission  Medication Sig Dispense Refill Last Dose  . ondansetron (ZOFRAN ODT) 8 MG disintegrating tablet Take 1 tablet (8 mg total) by mouth every 8 (eight) hours as needed for nausea or vomiting. 20 tablet 3 03/19/2017 at Unknown time  . Prenatal Vit-Fe Phos-FA-Omega (VITAFOL GUMMIES) 3.33-0.333-34.8 MG CHEW Chew 1 each by mouth 3 (three) times daily.   Past Week at Unknown time  . promethazine (PHENERGAN) 25 MG suppository Place 1 suppository (25 mg total) rectally every 6 (six) hours as needed for nausea or vomiting. 30 suppository 4 Taking  . promethazine (PHENERGAN) 25 MG suppository Place 1 suppository (25 mg total) rectally every 6 (six) hours as needed for nausea or vomiting. 20 suppository 2     I have reviewed patient's Past Medical Hx, Surgical Hx, Family Hx, Social Hx, medications and allergies.   ROS:  Review of Systems  Constitutional: Negative for chills and fever.  HENT: Negative for congestion.   Respiratory: Negative for cough.   Cardiovascular: Negative for chest pain.  Gastrointestinal: Negative for abdominal pain, anorexia, nausea and vomiting.  Musculoskeletal: Negative for myalgias.  Neurological: Negative for headaches.   Other systems negative  Physical Exam  Patient Vitals  for the past 24 hrs:  BP Temp Temp src Pulse Resp SpO2 Height Weight  04/18/17 2043 108/70 98.4 F (36.9 C) Oral 76 16 100 % 5\' 5"  (1.651 m) 223 lb (101.2 kg)   Constitutional: Well-developed, well-nourished female in no acute distress.  EENT:   Throat erethematous, no white patches,  Ears normal bilaterally Cardiovascular: normal rate and rhythm Respiratory: normal effort, clear to auscultation bilaterally GI: Abd soft, non-tender, gravid appropriate for gestational age.   No rebound or guarding. MS: Extremities nontender, no  edema, normal ROM Neurologic: Alert and oriented x 4.  GU: Neg CVAT.  PELVIC EXAM:  not indicated  FHT:  160   Labs:  Throat culture No results found for this or any previous visit (from the past 24 hour(s)).   Imaging:  No results found.  MAU Course/MDM: I have ordered throat culture due to erethema in pharynx Will treat presumptively due to pain and erethema GI cocktail given with some relief of pain Pepcid given for acid reduction.     Assessment: Single IUP at [redacted]w[redacted]d Throat pain - Plan: Discharge patient  Gastroesophageal reflux disease with esophagitis - Plan: Discharge patient  Hyperemesis affecting pregnancy, antepartum - Plan: Discharge patient    Plan: Discharge home Rx Protonix for GERD Throat culture Rx Amoxicillin for sore throat Follow up in Office for prenatal visits and recheck  Encouraged to return here or to other Urgent Care/ED if she develops worsening of symptoms, increase in pain, fever, or other concerning symptoms.   May need endoscopy if does not improve  Pt stable at time of discharge.  Wynelle Bourgeois CNM, MSN Certified Nurse-Midwife 04/18/2017 11:30 PM

## 2017-04-18 NOTE — MAU Note (Signed)
Urine sent to lab 

## 2017-04-18 NOTE — MAU Note (Signed)
Pt states that since yesterday afternoon she has a feeling that something is stuck in throat. States she drank ginger tea but now she has a lot of burning in throat and chest. Pt denies vomiting. Does not take any medications for acid reflux or heartburn. Pt denies abdominal pain, vaginal bleeding or LOF.

## 2017-04-19 DIAGNOSIS — O9989 Other specified diseases and conditions complicating pregnancy, childbirth and the puerperium: Secondary | ICD-10-CM

## 2017-04-19 DIAGNOSIS — R070 Pain in throat: Secondary | ICD-10-CM

## 2017-04-19 MED ORDER — FAMOTIDINE 20 MG PO TABS
20.0000 mg | ORAL_TABLET | Freq: Once | ORAL | Status: AC
  Administered 2017-04-19: 20 mg via ORAL
  Filled 2017-04-19: qty 1

## 2017-04-19 MED ORDER — AMOXICILLIN 500 MG PO CAPS
500.0000 mg | ORAL_CAPSULE | Freq: Three times a day (TID) | ORAL | 0 refills | Status: DC
Start: 2017-04-19 — End: 2017-06-07

## 2017-04-19 MED ORDER — PANTOPRAZOLE SODIUM 20 MG PO TBEC: 20.0000 mg | DELAYED_RELEASE_TABLET | Freq: Every day | ORAL | 0 refills | Status: DC

## 2017-04-19 NOTE — Discharge Instructions (Signed)
Esophagitis Esophagitis is inflammation of the esophagus. The esophagus is the tube that carries food and liquids from your mouth to your stomach. Esophagitis can cause soreness or pain in the esophagus. This condition can make it difficult and painful to swallow. What are the causes? Most causes of esophagitis are not serious. Common causes of this condition include:  Gastroesophageal reflux disease (GERD). This is when stomach contents move back up into the esophagus (reflux).  Repeated vomiting.  An allergic-type reaction, especially caused by food allergies (eosinophilic esophagitis).  Injury to the esophagus by swallowing large pills with or without water, or swallowing certain types of medicines.  Swallowing (ingesting) harmful chemicals, such as household cleaning products.  Heavy alcohol use.  An infection of the esophagus.This most often occurs in people who have a weakened immune system.  Radiation or chemotherapy treatment for cancer.  Certain diseases such as sarcoidosis, Crohn disease, and scleroderma.  What are the signs or symptoms? Symptoms of this condition include:  Difficult or painful swallowing.  Pain with swallowing acidic liquids, such as citrus juices.  Pain with burping.  Chest pain.  Difficulty breathing.  Nausea.  Vomiting.  Pain in the abdomen.  Weight loss.  Ulcers in the mouth.  Patches of white material in the mouth (candidiasis).  Fever.  Coughing up blood or vomiting blood.  Stool that is black, tarry, or bright red.  How is this diagnosed? Your health care provider will take a medical history and perform a physical exam. You may also have other tests, including:  An endoscopy to examine your stomach and esophagus with a small camera.  A test that measures the acidity level in your esophagus.  A test that measures how much pressure is on your esophagus.  A barium swallow or modified barium swallow to show the shape,  size, and functioning of your esophagus.  Allergy tests.  How is this treated? Treatment for this condition depends on the cause of your esophagitis. In some cases, steroids or other medicines may be given to help relieve your symptoms or to treat the underlying cause of your condition. You may have to make some lifestyle changes, such as:  Avoiding alcohol.  Quitting smoking.  Changing your diet.  Exercising.  Changing your sleep habits and your sleep environment.  Follow these instructions at home: Take these actions to decrease your discomfort and to help avoid complications. Diet  Follow a diet as recommended by your health care provider. This may involve avoiding foods and drinks such as: ? Coffee and tea (with or without caffeine). ? Drinks that contain alcohol. ? Energy drinks and sports drinks. ? Carbonated drinks or sodas. ? Chocolate and cocoa. ? Peppermint and mint flavorings. ? Garlic and onions. ? Horseradish. ? Spicy and acidic foods, including peppers, chili powder, curry powder, vinegar, hot sauces, and barbecue sauce. ? Citrus fruit juices and citrus fruits, such as oranges, lemons, and limes. ? Tomato-based foods, such as red sauce, chili, salsa, and pizza with red sauce. ? Fried and fatty foods, such as donuts, french fries, potato chips, and high-fat dressings. ? High-fat meats, such as hot dogs and fatty cuts of red and white meats, such as rib eye steak, sausage, ham, and bacon. ? High-fat dairy items, such as whole milk, butter, and cream cheese.  Eat small, frequent meals instead of large meals.  Avoid drinking large amounts of liquid with your meals.  Avoid eating meals during the 2-3 hours before bedtime.  Avoid lying down right  after you eat.  Do not exercise right after you eat.  Avoid foods and drinks that seem to make your symptoms worse. General instructions  Pay attention to any changes in your symptoms.  Take over-the-counter and  prescription medicines only as told by your health care provider. Do not take aspirin, ibuprofen, or other NSAIDs unless your health care provider told you to do so.  If you have trouble taking pills, use a pill splitter to decrease the size of the pill. This will decrease the chance of the pill getting stuck or injuring your esophagus on the way down. Also, drink water after you take a pill.  Do not use any tobacco products, including cigarettes, chewing tobacco, and e-cigarettes. If you need help quitting, ask your health care provider.  Wear loose-fitting clothing. Do not wear anything tight around your waist that causes pressure on your abdomen.  Raise (elevate) the head of your bed about 6 inches (15 cm).  Try to reduce your stress, such as with yoga or meditation. If you need help reducing stress, ask your health care provider.  If you are overweight, reduce your weight to an amount that is healthy for you. Ask your health care provider for guidance about a safe weight loss goal.  Keep all follow-up visits as told by your health care provider. This is important. Contact a health care provider if:  You have new symptoms.  You have unexplained weight loss.  You have difficulty swallowing, or it hurts to swallow.  You have wheezing or a persistent cough.  Your symptoms do not improve with treatment.  You have frequent heartburn for more than two weeks. Get help right away if:  You have severe pain in your arms, neck, jaw, teeth, or back.  You feel sweaty, dizzy, or light-headed.  You have chest pain or shortness of breath.  You vomit and your vomit looks like blood or coffee grounds.  Your stool is bloody or black.  You have a fever.  You cannot swallow, drink, or eat. This information is not intended to replace advice given to you by your health care provider. Make sure you discuss any questions you have with your health care provider. Document Released: 09/19/2004  Document Revised: 01/18/2016 Document Reviewed: 12/07/2014 Elsevier Interactive Patient Education  2018 [employer]  Pharyngitis Pharyngitis is a sore throat (pharynx). There is redness, pain, and swelling of your throat. Follow these instructions at home:  Drink enough fluids to keep your pee (urine) clear or pale yellow.  Only take medicine as told by your doctor. ? You may get sick again if you do not take medicine as told. Finish your medicines, even if you start to feel better. ? Do not take aspirin.  Rest.  Rinse your mouth (gargle) with salt water ( tsp of salt per 1 qt of water) every 1-2 hours. This will help the pain.  If you are not at risk for choking, you can suck on hard candy or sore throat lozenges. Contact a doctor if:  You have large, tender lumps on your neck.  You have a rash.  You cough up green, yellow-brown, or bloody spit. Get help right away if:  You have a stiff neck.  You drool or cannot swallow liquids.  You throw up (vomit) or are not able to keep medicine or liquids down.  You have very bad pain that does not go away with medicine.  You have problems breathing (not from a stuffy nose). This  information is not intended to replace advice given to you by your health care provider. Make sure you discuss any questions you have with your health care provider. Document Released: 01/29/2008 Document Revised: 01/18/2016 Document Reviewed: 04/19/2013 Elsevier Interactive Patient Education  2017 [employer].

## 2017-04-21 LAB — CULTURE, GROUP A STREP (THRC)

## 2017-04-23 ENCOUNTER — Other Ambulatory Visit (HOSPITAL_COMMUNITY): Payer: Self-pay | Admitting: Nurse Practitioner

## 2017-04-23 DIAGNOSIS — Z363 Encounter for antenatal screening for malformations: Secondary | ICD-10-CM

## 2017-04-23 DIAGNOSIS — Z3689 Encounter for other specified antenatal screening: Secondary | ICD-10-CM

## 2017-05-09 ENCOUNTER — Ambulatory Visit (HOSPITAL_COMMUNITY)
Admission: RE | Admit: 2017-05-09 | Discharge: 2017-05-09 | Disposition: A | Discharge status: Home / Self Care | Payer: Medicaid Other | Source: Ambulatory Visit | Attending: Nurse Practitioner | Admitting: Nurse Practitioner

## 2017-05-13 ENCOUNTER — Ambulatory Visit (HOSPITAL_COMMUNITY)
Admission: RE | Admit: 2017-05-13 | Discharge: 2017-05-13 | Disposition: A | Discharge status: Home / Self Care | Payer: Medicaid Other | Source: Ambulatory Visit | Attending: Nurse Practitioner | Admitting: Nurse Practitioner

## 2017-05-13 ENCOUNTER — Other Ambulatory Visit (HOSPITAL_COMMUNITY): Payer: Self-pay | Admitting: *Deleted

## 2017-05-13 DIAGNOSIS — Z3689 Encounter for other specified antenatal screening: Secondary | ICD-10-CM | POA: Insufficient documentation

## 2017-05-13 DIAGNOSIS — O99213 Obesity complicating pregnancy, third trimester: Secondary | ICD-10-CM

## 2017-05-13 DIAGNOSIS — Z3A23 23 weeks gestation of pregnancy: Secondary | ICD-10-CM | POA: Diagnosis not present

## 2017-05-13 DIAGNOSIS — O99212 Obesity complicating pregnancy, second trimester: Secondary | ICD-10-CM | POA: Insufficient documentation

## 2017-05-13 DIAGNOSIS — Z363 Encounter for antenatal screening for malformations: Secondary | ICD-10-CM

## 2017-05-13 DIAGNOSIS — O210 Mild hyperemesis gravidarum: Secondary | ICD-10-CM

## 2017-05-13 DIAGNOSIS — Z6836 Body mass index (BMI) 36.0-36.9, adult: Secondary | ICD-10-CM | POA: Diagnosis not present

## 2017-06-07 ENCOUNTER — Encounter (HOSPITAL_COMMUNITY): Payer: Self-pay | Admitting: *Deleted

## 2017-06-07 ENCOUNTER — Inpatient Hospital Stay (HOSPITAL_COMMUNITY)
Admission: AD | Admit: 2017-06-07 | Discharge: 2017-06-07 | Disposition: A | Discharge status: Home / Self Care | Payer: Medicaid Other | Source: Ambulatory Visit | Attending: Obstetrics & Gynecology | Admitting: Obstetrics & Gynecology

## 2017-06-07 DIAGNOSIS — O219 Vomiting of pregnancy, unspecified: Secondary | ICD-10-CM | POA: Diagnosis not present

## 2017-06-07 DIAGNOSIS — O210 Mild hyperemesis gravidarum: Secondary | ICD-10-CM

## 2017-06-07 DIAGNOSIS — O212 Late vomiting of pregnancy: Secondary | ICD-10-CM | POA: Insufficient documentation

## 2017-06-07 DIAGNOSIS — Z3A27 27 weeks gestation of pregnancy: Secondary | ICD-10-CM | POA: Diagnosis not present

## 2017-06-07 LAB — URINALYSIS, ROUTINE W REFLEX MICROSCOPIC
BILIRUBIN URINE: NEGATIVE
GLUCOSE, UA: NEGATIVE mg/dL
HGB URINE DIPSTICK: NEGATIVE
Ketones, ur: NEGATIVE mg/dL
NITRITE: NEGATIVE
PROTEIN: NEGATIVE mg/dL
Specific Gravity, Urine: 1.017 (ref 1.005–1.030)
pH: 6 (ref 5.0–8.0)

## 2017-06-07 MED ORDER — ONDANSETRON 8 MG PO TBDP
8.0000 mg | ORAL_TABLET | Freq: Once | ORAL | Status: AC
  Administered 2017-06-07: 8 mg via ORAL
  Filled 2017-06-07: qty 1

## 2017-06-07 NOTE — MAU Provider Note (Signed)
History   G1 @ 27.2 wks in with nausea, states feels bad, vomited twice yesterday. Diet review done pt only eats once a day. Denies ROM or vag bleeding.  CSN: [id number]  Arrival date & time 06/07/17  1637   None     Chief Complaint  Patient presents with  . Emesis    HPI  Past Medical History:  Diagnosis Date  . Medical history non-contributory     Past Surgical History:  Procedure Laterality Date  . NO PAST SURGERIES      History reviewed. No pertinent family history.  Social History  Substance Use Topics  . Smoking status: Never Smoker  . Smokeless tobacco: Never Used  . Alcohol use No    OB History    Gravida Para Term Preterm AB Living   1             SAB TAB Ectopic Multiple Live Births                  Review of Systems  Constitutional: Negative.   HENT: Negative.   Eyes: Negative.   Respiratory: Negative.   Cardiovascular: Negative.   Gastrointestinal: Positive for nausea and vomiting.  Endocrine: Negative.   Genitourinary: Negative.   Musculoskeletal: Negative.   Skin: Negative.   Allergic/Immunologic: Negative.   Neurological: Negative.   Hematological: Negative.   Psychiatric/Behavioral: Negative.     Allergies  Patient has no known allergies.  Home Medications    BP 105/67 (BP Location: Left Arm)   Pulse 87   Temp 98.8 F (37.1 C) (Oral)   Resp 18   Ht  (1.651 m)   Wt 224 lb (101.6 kg)   LMP 11/28/2016   SpO2 100%   BMI 37.28 kg/m   Physical Exam  Constitutional: She is oriented to person, place, and time. She appears well-developed and well-nourished.  HENT:  Head: Normocephalic.  Eyes: Pupils are equal, round, and reactive to light.  Neck: Normal range of motion.  Cardiovascular: Normal rate, regular rhythm, normal heart sounds and intact distal pulses.   Pulmonary/Chest: Effort normal and breath sounds normal.  Abdominal: Soft. Bowel sounds are normal.  Musculoskeletal: Normal range of motion.  Neurological:  She is alert and oriented to person, place, and time. She has normal reflexes.  Skin: Skin is warm and dry.  Psychiatric: She has a normal mood and affect. Her behavior is normal. Judgment and thought content normal.    MAU Course  Procedures (including critical care time)  Labs Reviewed  URINALYSIS, ROUTINE W REFLEX MICROSCOPIC - Abnormal; Notable for the following:       Result Value   APPearance HAZY (*)    Leukocytes, UA SMALL (*)    Bacteria, UA RARE (*)    Squamous Epithelial / LPF 0-5 (*)    All other components within normal limits   No results found.   1. Nausea/vomiting in pregnancy   2. Hyperemesis affecting pregnancy, antepartum       MDM  zofran 8 mg ODT, lengthy discussion on sm frequent meals instead of 1 large meal a day. Pt verbalized understanding. Will d/c home in stable condition. U/A normal

## 2017-06-07 NOTE — MAU Note (Signed)
Vomiting every day this week except today. Vomit x2 in last 24 hours, denies diarrhea. Feeling weak today.  Positive for fetal movement and denies sudden gush of fluid, and no vaginal bleeding. EFM applied - FHR -130s, toco applied - abd. Soft.

## 2017-06-07 NOTE — Discharge Instructions (Signed)
Morning Sickness °Morning sickness is when you feel sick to your stomach (nauseous) during pregnancy. This nauseous feeling may or may not come with vomiting. It often occurs in the morning but can be a problem any time of day. Morning sickness is most common during the first trimester, but it may continue throughout pregnancy. While morning sickness is unpleasant, it is usually harmless unless you develop severe and continual vomiting (hyperemesis gravidarum). This condition requires more intense treatment. °What are the causes? °The cause of morning sickness is not completely known but seems to be related to normal hormonal changes that occur in pregnancy. °What increases the risk? °You are at greater risk if you: °· Experienced nausea or vomiting before your pregnancy. °· Had morning sickness during a previous pregnancy. °· Are pregnant with more than one baby, such as twins. ° °How is this treated? °Do not use any medicines (prescription, over-the-counter, or herbal) for morning sickness without first talking to your health care provider. Your health care provider may prescribe or recommend: °· Vitamin B6 supplements. °· Anti-nausea medicines. °· The herbal medicine ginger. ° °Follow these instructions at home: °· Only take over-the-counter or prescription medicines as directed by your health care provider. °· Taking multivitamins before getting pregnant can prevent or decrease the severity of morning sickness in most women. °· Eat a piece of dry toast or unsalted crackers before getting out of bed in the morning. °· Eat five or six small meals a day. °· Eat dry and bland foods (rice, baked potato). Foods high in carbohydrates are often helpful. °· Do not drink liquids with your meals. Drink liquids between meals. °· Avoid greasy, fatty, and spicy foods. °· Get someone to cook for you if the smell of any food causes nausea and vomiting. °· If you feel nauseous after taking prenatal vitamins, take the vitamins at  night or with a snack. °· Snack on protein foods (nuts, yogurt, cheese) between meals if you are hungry. °· Eat unsweetened gelatins for desserts. °· Wearing an acupressure wristband (worn for sea sickness) may be helpful. °· Acupuncture may be helpful. °· Do not smoke. °· Get a humidifier to keep the air in your house free of odors. °· Get plenty of fresh air. °Contact a health care provider if: °· Your home remedies are not working, and you need medicine. °· You feel dizzy or lightheaded. °· You are losing weight. °Get help right away if: °· You have persistent and uncontrolled nausea and vomiting. °· You pass out (faint). °This information is not intended to replace advice given to you by your health care provider. Make sure you discuss any questions you have with your health care provider. °Document Released: 10/03/2006 Document Revised: 01/18/2016 Document Reviewed: 01/27/2013 °Elsevier Interactive Patient Education © 2017 [employer] ° °

## 2017-06-24 ENCOUNTER — Encounter (HOSPITAL_COMMUNITY): Payer: Self-pay

## 2017-06-24 ENCOUNTER — Ambulatory Visit (HOSPITAL_COMMUNITY)
Admission: RE | Admit: 2017-06-24 | Discharge: 2017-06-24 | Disposition: A | Discharge status: Home / Self Care | Payer: Medicaid Other | Source: Ambulatory Visit | Attending: Nurse Practitioner | Admitting: Nurse Practitioner

## 2017-06-24 ENCOUNTER — Other Ambulatory Visit (HOSPITAL_COMMUNITY): Payer: Self-pay | Admitting: Obstetrics and Gynecology

## 2017-06-24 DIAGNOSIS — Z362 Encounter for other antenatal screening follow-up: Secondary | ICD-10-CM | POA: Diagnosis not present

## 2017-06-24 DIAGNOSIS — Z3A29 29 weeks gestation of pregnancy: Secondary | ICD-10-CM

## 2017-06-24 DIAGNOSIS — Z0489 Encounter for examination and observation for other specified reasons: Secondary | ICD-10-CM

## 2017-06-24 DIAGNOSIS — IMO0002 Reserved for concepts with insufficient information to code with codable children: Secondary | ICD-10-CM

## 2017-06-24 DIAGNOSIS — O99213 Obesity complicating pregnancy, third trimester: Secondary | ICD-10-CM

## 2017-07-20 ENCOUNTER — Inpatient Hospital Stay (HOSPITAL_COMMUNITY)
Admission: AD | Admit: 2017-07-20 | Discharge: 2017-07-20 | Disposition: A | Discharge status: Home / Self Care | Payer: Medicaid Other | Source: Ambulatory Visit | Attending: Obstetrics and Gynecology | Admitting: Obstetrics and Gynecology

## 2017-07-20 DIAGNOSIS — O210 Mild hyperemesis gravidarum: Secondary | ICD-10-CM

## 2017-07-20 DIAGNOSIS — Z3A33 33 weeks gestation of pregnancy: Secondary | ICD-10-CM | POA: Insufficient documentation

## 2017-07-20 DIAGNOSIS — O2243 Hemorrhoids in pregnancy, third trimester: Secondary | ICD-10-CM | POA: Diagnosis present

## 2017-07-20 MED ORDER — HYDROCORTISONE ACE-PRAMOXINE 1-1 % RE FOAM: 1.0000 | Freq: Two times a day (BID) | RECTAL | 11 refills | Status: AC

## 2017-07-20 MED ORDER — HYDROCORTISONE ACE-PRAMOXINE 1-1 % RE FOAM
1.0000 | Freq: Two times a day (BID) | RECTAL | Status: DC
  Administered 2017-07-20: 1 via RECTAL
  Filled 2017-07-20: qty 10

## 2017-07-20 NOTE — MAU Provider Note (Signed)
History   G1 @ 33.3 wks in with c/o pain in rectal area that started last week and has progressively goten more painful. States can feel something back there that is painful to touch. denies abd pain, vag bleeding or ROM.  CSN: [id number]  Arrival date & time 07/20/17  1256   None     Chief Complaint  Patient presents with  . Hemorrhoids    HPI  Past Medical History:  Diagnosis Date  . Medical history non-contributory     Past Surgical History:  Procedure Laterality Date  . NO PAST SURGERIES      No family history on file.  Social History   Tobacco Use  . Smoking status: Never Smoker  . Smokeless tobacco: Never Used  Substance Use Topics  . Alcohol use: No  . Drug use: No    OB History    Gravida Para Term Preterm AB Living   1             SAB TAB Ectopic Multiple Live Births                  Review of Systems  Allergies  Patient has no known allergies.  Home Medications    BP 110/66 (BP Location: Right Arm)   Pulse 95   Temp 98.7 F (37.1 C) (Oral)   Resp 16   Ht 5\' 5"  (1.651 m)   Wt 231 lb (104.8 kg)   LMP 11/28/2016   SpO2 100%   BMI 38.44 kg/m   Physical Exam  MAU Course  Procedures (including critical care time)  Labs Reviewed - No data to display No results found.   1. Hemorrhoids during pregnancy in third trimester   2. Hyperemesis affecting pregnancy, antepartum       MDM  VSS, FHR pattern stable, mod variability. Exam reveal several small non thrombosed hemorrhoids. Discussed high fiber diet, stool softners, increased water. Will d/c home on procto foam HC

## 2017-07-20 NOTE — MAU Note (Signed)
Pt reports she has what she thinks is a hemorrhoid. States she first noticed it last week but now it is much bigger.

## 2017-07-20 NOTE — Discharge Instructions (Signed)
Hemorrhoids Hemorrhoids are swollen veins in and around the rectum or anus. There are two types of hemorrhoids:  Internal hemorrhoids. These occur in the veins that are just inside the rectum. They may poke through to the outside and become irritated and painful.  External hemorrhoids. These occur in the veins that are outside of the anus and can be felt as a painful swelling or hard lump near the anus.  Most hemorrhoids do not cause serious problems, and they can be managed with home treatments such as diet and lifestyle changes. If home treatments do not help your symptoms, procedures can be done to shrink or remove the hemorrhoids. What are the causes? This condition is caused by increased pressure in the anal area. This pressure may result from various things, including:  Constipation.  Straining to have a bowel movement.  Diarrhea.  Pregnancy.  Obesity.  Sitting for long periods of time.  Heavy lifting or other activity that causes you to strain.  Anal sex.  What are the signs or symptoms? Symptoms of this condition include:  Pain.  Anal itching or irritation.  Rectal bleeding.  Leakage of stool (feces).  Anal swelling.  One or more lumps around the anus.  How is this diagnosed? This condition can often be diagnosed through a visual exam. Other exams or tests may also be done, such as:  Examination of the rectal area with a gloved hand (digital rectal exam).  Examination of the anal canal using a small tube (anoscope).  A blood test, if you have lost a significant amount of blood.  A test to look inside the colon (sigmoidoscopy or colonoscopy).  How is this treated? This condition can usually be treated at home. However, various procedures may be done if dietary changes, lifestyle changes, and other home treatments do not help your symptoms. These procedures can help make the hemorrhoids smaller or remove them completely. Some of these procedures involve  surgery, and others do not. Common procedures include:  Rubber band ligation. Rubber bands are placed at the base of the hemorrhoids to cut off the blood supply to them.  Sclerotherapy. Medicine is injected into the hemorrhoids to shrink them.  Infrared coagulation. A type of light energy is used to get rid of the hemorrhoids.  Hemorrhoidectomy surgery. The hemorrhoids are surgically removed, and the veins that supply them are tied off.  Stapled hemorrhoidopexy surgery. A circular stapling device is used to remove the hemorrhoids and use staples to cut off the blood supply to them.  Follow these instructions at home: Eating and drinking  Eat foods that have a lot of fiber in them, such as whole grains, beans, nuts, fruits, and vegetables. Ask your health care provider about taking products that have added fiber (fiber supplements).  Drink enough fluid to keep your urine clear or pale yellow. Managing pain and swelling  Take warm sitz baths for 20 minutes, 3-4 times a day to ease pain and discomfort.  If directed, apply ice to the affected area. Using ice packs between sitz baths may be helpful. ? Put ice in a plastic bag. ? Place a towel between your skin and the bag. ? Leave the ice on for 20 minutes, 2-3 times a day. General instructions  Take over-the-counter and prescription medicines only as told by your health care provider.  Use medicated creams or suppositories as told.  Exercise regularly.  Go to the bathroom when you have the urge to have a bowel movement. Do not wait.    Avoid straining to have bowel movements.  Keep the anal area dry and clean. Use wet toilet paper or moist towelettes after a bowel movement.  Do not sit on the toilet for long periods of time. This increases blood pooling and pain. Contact a health care provider if:  You have increasing pain and swelling that are not controlled by treatment or medicine.  You have uncontrolled bleeding.  You  have difficulty having a bowel movement, or you are unable to have a bowel movement.  You have pain or inflammation outside the area of the hemorrhoids. This information is not intended to replace advice given to you by your health care provider. Make sure you discuss any questions you have with your health care provider. Document Released: 08/09/2000 Document Revised: 01/10/2016 Document Reviewed: 04/26/2015 Elsevier Interactive Patient Education  2017 [employer]vier Inc[employer]  

## 2017-08-26 NOTE — L&D Delivery Note (Signed)
Delivery Note At 11:21 PM a viable female was delivered via Vaginal, Spontaneous (Presentation: LOA; ).  APGAR: 8, 9; weight pending.   Placenta status: spontaneous, intact.  Cord:  3 vessel.   Anesthesia:  Epidural  Episiotomy: None Lacerations: Left sulcal tear  Suture Repair: 2.0  Est. Blood Loss (mL): 400  Mom to postpartum.  Baby to Couplet care / Skin to Skin.  Freddrick MarchYashika Vaniyah Lansky, MD  08/31/2017, 11:51 PM

## 2017-08-31 ENCOUNTER — Encounter (HOSPITAL_COMMUNITY): Payer: Self-pay

## 2017-08-31 ENCOUNTER — Encounter (HOSPITAL_COMMUNITY): Payer: Self-pay | Admitting: Anesthesiology

## 2017-08-31 ENCOUNTER — Inpatient Hospital Stay (HOSPITAL_COMMUNITY): Payer: Medicaid Other | Admitting: Anesthesiology

## 2017-08-31 ENCOUNTER — Inpatient Hospital Stay (HOSPITAL_COMMUNITY)
Admission: AD | Admit: 2017-08-31 | Discharge: 2017-09-02 | DRG: 807 | Disposition: A | Discharge status: Home / Self Care | Payer: Medicaid Other | Source: Ambulatory Visit | Attending: Obstetrics & Gynecology | Admitting: Obstetrics & Gynecology

## 2017-08-31 ENCOUNTER — Other Ambulatory Visit: Payer: Self-pay

## 2017-08-31 DIAGNOSIS — D649 Anemia, unspecified: Secondary | ICD-10-CM | POA: Diagnosis present

## 2017-08-31 DIAGNOSIS — E669 Obesity, unspecified: Secondary | ICD-10-CM | POA: Diagnosis present

## 2017-08-31 DIAGNOSIS — O210 Mild hyperemesis gravidarum: Secondary | ICD-10-CM | POA: Diagnosis present

## 2017-08-31 DIAGNOSIS — O99214 Obesity complicating childbirth: Secondary | ICD-10-CM | POA: Diagnosis present

## 2017-08-31 DIAGNOSIS — Z3483 Encounter for supervision of other normal pregnancy, third trimester: Secondary | ICD-10-CM | POA: Diagnosis present

## 2017-08-31 DIAGNOSIS — Z3A39 39 weeks gestation of pregnancy: Secondary | ICD-10-CM | POA: Diagnosis not present

## 2017-08-31 DIAGNOSIS — O9902 Anemia complicating childbirth: Secondary | ICD-10-CM | POA: Diagnosis present

## 2017-08-31 DIAGNOSIS — Z3490 Encounter for supervision of normal pregnancy, unspecified, unspecified trimester: Secondary | ICD-10-CM

## 2017-08-31 LAB — CBC
HEMATOCRIT: 35.8 % — AB (ref 36.0–46.0)
HEMOGLOBIN: 12.2 g/dL (ref 12.0–15.0)
MCH: 31 pg (ref 26.0–34.0)
MCHC: 34.1 g/dL (ref 30.0–36.0)
MCV: 91.1 fL (ref 78.0–100.0)
Platelets: 261 10*3/uL (ref 150–400)
RBC: 3.93 MIL/uL (ref 3.87–5.11)
RDW: 14.1 % (ref 11.5–15.5)
WBC: 6.7 10*3/uL (ref 4.0–10.5)

## 2017-08-31 LAB — ABO/RH: ABO/RH(D): B POS

## 2017-08-31 LAB — TYPE AND SCREEN
ABO/RH(D): B POS
ANTIBODY SCREEN: NEGATIVE

## 2017-08-31 MED ORDER — EPHEDRINE 5 MG/ML INJ
10.0000 mg | INTRAVENOUS | Status: DC | PRN
  Filled 2017-08-31: qty 2

## 2017-08-31 MED ORDER — ACETAMINOPHEN 325 MG PO TABS
650.0000 mg | ORAL_TABLET | ORAL | Status: DC | PRN
  Administered 2017-09-01: 650 mg via ORAL
  Filled 2017-08-31 (×2): qty 2

## 2017-08-31 MED ORDER — DIPHENHYDRAMINE HCL 50 MG/ML IJ SOLN: 12.5000 mg | INTRAMUSCULAR | Status: DC | PRN

## 2017-08-31 MED ORDER — PHENYLEPHRINE 40 MCG/ML (10ML) SYRINGE FOR IV PUSH (FOR BLOOD PRESSURE SUPPORT)
80.0000 ug | PREFILLED_SYRINGE | INTRAVENOUS | Status: DC | PRN
  Administered 2017-08-31: 80 ug via INTRAVENOUS
  Filled 2017-08-31: qty 5

## 2017-08-31 MED ORDER — TERBUTALINE SULFATE 1 MG/ML IJ SOLN
0.2500 mg | Freq: Once | INTRAMUSCULAR | Status: DC | PRN
  Filled 2017-08-31: qty 1

## 2017-08-31 MED ORDER — OXYCODONE-ACETAMINOPHEN 5-325 MG PO TABS: 1.0000 | ORAL_TABLET | ORAL | Status: DC | PRN

## 2017-08-31 MED ORDER — OXYTOCIN 40 UNITS IN LACTATED RINGERS INFUSION - SIMPLE MED
2.5000 [IU]/h | INTRAVENOUS | Status: DC
  Filled 2017-08-31: qty 1000

## 2017-08-31 MED ORDER — LIDOCAINE HCL (PF) 1 % IJ SOLN
30.0000 mL | INTRAMUSCULAR | Status: DC | PRN
  Filled 2017-08-31: qty 30

## 2017-08-31 MED ORDER — MORPHINE SULFATE (PF) 10 MG/ML IV SOLN
10.0000 mg | Freq: Once | INTRAVENOUS | Status: AC
  Administered 2017-08-31: 10 mg via INTRAMUSCULAR
  Filled 2017-08-31: qty 1

## 2017-08-31 MED ORDER — OXYTOCIN 40 UNITS IN LACTATED RINGERS INFUSION - SIMPLE MED
1.0000 m[IU]/min | INTRAVENOUS | Status: DC
  Administered 2017-08-31: 2 m[IU]/min via INTRAVENOUS

## 2017-08-31 MED ORDER — LIDOCAINE HCL (PF) 1 % IJ SOLN
INTRAMUSCULAR | Status: DC | PRN
  Administered 2017-08-31 (×2): 5 mL

## 2017-08-31 MED ORDER — OXYCODONE-ACETAMINOPHEN 5-325 MG PO TABS: 2.0000 | ORAL_TABLET | ORAL | Status: DC | PRN

## 2017-08-31 MED ORDER — LACTATED RINGERS IV SOLN
500.0000 mL | Freq: Once | INTRAVENOUS | Status: AC
  Administered 2017-08-31: 500 mL via INTRAVENOUS

## 2017-08-31 MED ORDER — LACTATED RINGERS IV SOLN
500.0000 mL | INTRAVENOUS | Status: DC | PRN
  Administered 2017-08-31: 500 mL via INTRAVENOUS

## 2017-08-31 MED ORDER — FENTANYL CITRATE (PF) 100 MCG/2ML IJ SOLN: 100.0000 ug | INTRAMUSCULAR | Status: DC | PRN

## 2017-08-31 MED ORDER — PHENYLEPHRINE 40 MCG/ML (10ML) SYRINGE FOR IV PUSH (FOR BLOOD PRESSURE SUPPORT)
80.0000 ug | PREFILLED_SYRINGE | INTRAVENOUS | Status: DC | PRN
  Filled 2017-08-31: qty 5
  Filled 2017-08-31: qty 10

## 2017-08-31 MED ORDER — FENTANYL 2.5 MCG/ML BUPIVACAINE 1/10 % EPIDURAL INFUSION (WH - ANES)
14.0000 mL/h | INTRAMUSCULAR | Status: DC | PRN
  Administered 2017-08-31 (×2): 14 mL/h via EPIDURAL
  Filled 2017-08-31 (×2): qty 100

## 2017-08-31 MED ORDER — ONDANSETRON HCL 4 MG/2ML IJ SOLN
4.0000 mg | Freq: Four times a day (QID) | INTRAMUSCULAR | Status: DC | PRN
  Administered 2017-08-31: 4 mg via INTRAVENOUS
  Filled 2017-08-31: qty 2

## 2017-08-31 MED ORDER — SOD CITRATE-CITRIC ACID 500-334 MG/5ML PO SOLN: 30.0000 mL | ORAL | Status: DC | PRN

## 2017-08-31 MED ORDER — LACTATED RINGERS IV SOLN
INTRAVENOUS | Status: DC
  Administered 2017-08-31 (×2): via INTRAVENOUS

## 2017-08-31 MED ORDER — OXYTOCIN BOLUS FROM INFUSION
500.0000 mL | Freq: Once | INTRAVENOUS | Status: AC
  Administered 2017-08-31: 500 mL via INTRAVENOUS

## 2017-08-31 NOTE — Anesthesia Preprocedure Evaluation (Signed)

## 2017-08-31 NOTE — H&P (Signed)
LABOR AND DELIVERY ADMISSION HISTORY AND PHYSICAL NOTE  Stephanie Choi is a 27 y.o. female G1P0 with IUP at 3551w3d by LMP presenting for active labor. Patient reports having fluid leakage at home and some contractions which brought her to the MAU. She reports positive fetal movement. She denies vaginal bleeding.  [facility] Pregnancy complications:  - Anemia - Varicella non immune - obesity   Past Medical History: Past Medical History:  Diagnosis Date  . Medical history non-contributory     Past Surgical History: Past Surgical History:  Procedure Laterality Date  . NO PAST SURGERIES      Obstetrical History: OB History    Gravida Para Term Preterm AB Living   1             SAB TAB Ectopic Multiple Live Births                  Social History: Social History   Socioeconomic History  . Marital status: Married    Spouse name: None  . Number of children: None  . Years of education: None  . Highest education level: None  Social Needs  . Financial resource strain: None  . Food insecurity - worry: None  . Food insecurity - inability: None  . Transportation needs - medical: None  . Transportation needs - non-medical: None  Occupational History  . None  Tobacco Use  . Smoking status: Never Smoker  . Smokeless tobacco: Never Used  Substance and Sexual Activity  . Alcohol use: No  . Drug use: No  . Sexual activity: Yes    Birth control/protection: None  Other Topics Concern  . None  Social History Narrative  . None    Family History: History reviewed. No pertinent family history.  Allergies: No Known Allergies  Medications Prior to Admission  Medication Sig Dispense Refill Last Dose  . calcium carbonate (TUMS - DOSED IN MG ELEMENTAL CALCIUM) 500 MG chewable tablet Chew 2 tablets by mouth 2 (two) times daily as needed for indigestion or heartburn.   Past Week at Unknown time  . flintstones complete (FLINTSTONES) 60 MG chewable  tablet Chew 2 tablets by mouth at bedtime.    08/30/2017 at Unknown time  . Iron-Vitamins (GERITOL) LIQD Take 15 mLs by mouth at bedtime.   Past Week at Unknown time  . ondansetron (ZOFRAN ODT) 8 MG disintegrating tablet Take 1 tablet (8 mg total) by mouth every 8 (eight) hours as needed for nausea or vomiting. 20 tablet 3 Past Week at Unknown time  . hydrocortisone-pramoxine (PROCTOFOAM-HC) rectal foam Place 1 applicator rectally 2 (two) times daily. (Patient not taking: Reported on 08/31/2017) 10 g 11 Not Taking at Unknown time     Review of Systems  All systems reviewed and negative except as stated in HPI  Physical Exam Blood pressure 117/72, pulse 95, temperature 98.4 F (36.9 C), temperature source Oral, resp. rate 16, height 5\' 5"  (1.651 m), weight 104.3 kg (230 lb), last menstrual period 11/28/2016. General appearance: alert, oriented, NAD Lungs: normal respiratory effort Heart: regular rate Abdomen: soft, non-tender; gravid, FH appropriate for GA Extremities: No calf swelling or tenderness Presentation: cephalic Fetal monitoring: FHR 145, mod variability, some accels  Uterine activity: minimal irregular contracitons  Dilation: 5 Effacement (%): 70 Station: -2 Exam by:: Sharen CounterLisa Leftwich Kirby CNM  Prenatal labs: ABO, Rh:  B Pos Antibody:  neg Rubella:  immune  RPR:    HBsAg:   Neg HIV:   Non  reactive  GC/Chlamydia: Neg GBS:  Neg  1-hr GTT: 117 Genetic screening:  Quad neg, CF neg Anatomy US: normal   Prenatal Transfer Tool  Maternal Diabetes: No Genetic Screening: Normal Maternal Ultrasounds/Referrals: Normal Fetal Ultrasounds or other Referrals:  None Maternal Substance Abuse:  No Significant Maternal Medications:  None Significant Maternal Lab Results: Lab values include: Group B Strep negative  No results found for this or any previous visit (from the past 24 hour(s)).  Patient Active Problem List   Diagnosis Date Noted  . Supervision of normal pregnancy  08/31/2017  . Hyperemesis affecting pregnancy, antepartum 02/23/2017    Assessment: Stephanie Choi is a 28 y.o. G1P0 at [redacted]w[redacted]d here for active labor.   #Labor: in active labor, amniotic sac intact per MAU CNM check (patient with traumatic vaginal exams so will defer exam on admission)  #Pain: Per patient request, plan for epidural  #FWB: Category 1  #ID:  n/a #MOF: breast and bottle  #MOC:undecided   Oralia Manis 08/31/2017, 1:59 PM I assessed this pt and agree with this assessment

## 2017-08-31 NOTE — MAU Note (Signed)
Starting leaking fluid around 3:00 am and having contractions.

## 2017-08-31 NOTE — Progress Notes (Signed)
Stephanie Choi is a 27 y.o. G1P0 at 3985w3d by LMP admitted for active labor  Subjective: Patient resting comfortably. No complaints at this time.   Objective: BP (!) 107/44   Pulse (!) 104   Temp 98.1 F (36.7 C) (Oral)   Resp 16   Ht 5\' 5"  (1.651 m)   Wt 104.3 kg (230 lb)   LMP 11/28/2016   BMI 38.27 kg/m  No intake/output data recorded. No intake/output data recorded.  FHT:  FHR: 140 bpm, variability: minimal ,  accelerations:  Abscent,  decelerations:  Absent UC:   irregular, every 1-5 minutes SVE:   Dilation: 5 Effacement (%): 100 Station: -1 Exam by:: Zerita Boersarlene Lawson, CNM  Labs: Lab Results  Component Value Date   WBC 6.7 08/31/2017   HGB 12.2 08/31/2017   HCT 35.8 (L) 08/31/2017   MCV 91.1 08/31/2017   PLT 261 08/31/2017    Assessment / Plan: Spontaneous labor, progressing normally  Labor: Progressing normally Fetal Wellbeing:  Category I Pain Control:  Epidural I/D:  n/a Anticipated MOD:  NSVD  Oralia ManisSherin Donyel Castagnola, DO PGY-1 08/31/2017, 7:14 PM

## 2017-08-31 NOTE — Progress Notes (Signed)
Stephanie Choi is a 27 y.o. G1P0 at 9764w3d by LMP admitted for active labor.   Subjective: Patient resting comfortably with husband at bedside. Reports good fetal movement.  No complaints at this time.   Objective: BP 105/80   Pulse (!) 108   Temp 97.8 F (36.6 C) (Oral)   Resp 18   Ht 5\' 5"  (1.651 m)   Wt 104.3 kg (230 lb)   LMP 11/28/2016   BMI 38.27 kg/m  No intake/output data recorded. No intake/output data recorded.  FHT:  FHR: 140 bpm, variability: minimal ,  accelerations:  Abscent,  decelerations:  Absent UC:   irregular, every 1-5 minutes SVE:   Dilation: Lip/rim Effacement (%): 100 Station: +1, +2 Exam by:: Irving BurtonEmily Rothermel RN   Labs: Lab Results  Component Value Date   WBC 6.7 08/31/2017   HGB 12.2 08/31/2017   HCT 35.8 (L) 08/31/2017   MCV 91.1 08/31/2017   PLT 261 08/31/2017    Assessment / Plan: Spontaneous labor, progressing normally Cervical check: 9.5 cm.  Anticipate NSVD.   Labor: Progressing normally Fetal Wellbeing:  Category I Pain Control:  Epidural I/D:  n/a Anticipated MOD:  NSVD  Freddrick MarchYashika Murphy Bundick, MD PGY-2 08/31/2017, 10:29 PM

## 2017-08-31 NOTE — Anesthesia Procedure Notes (Signed)
Epidural Patient location during procedure: OB  Staffing Anesthesiologist: Rashanda Magloire, MD Performed: anesthesiologist   Preanesthetic Checklist Completed: patient identified, site marked, surgical consent, pre-op evaluation, timeout performed, IV checked, risks and benefits discussed and monitors and equipment checked  Epidural Patient position: sitting Prep: DuraPrep Patient monitoring: heart rate, continuous pulse ox and blood pressure Approach: right paramedian Location: L3-L4 Injection technique: LOR saline  Needle:  Needle type: Tuohy  Needle gauge: 17 G Needle length: 9 cm and 9 Needle insertion depth: 7 cm Catheter type: closed end flexible Catheter size: 20 Guage Catheter at skin depth: 11 cm Test dose: negative  Assessment Events: blood not aspirated, injection not painful, no injection resistance, negative IV test and no paresthesia  Additional Notes Patient identified. Risks/Benefits/Options discussed with patient including but not limited to bleeding, infection, nerve damage, paralysis, failed block, incomplete pain control, headache, blood pressure changes, nausea, vomiting, reactions to medication both or allergic, itching and postpartum back pain. Confirmed with bedside nurse the patient's most recent platelet count. Confirmed with patient that they are not currently taking any anticoagulation, have any bleeding history or any family history of bleeding disorders. Patient expressed understanding and wished to proceed. All questions were answered. Sterile technique was used throughout the entire procedure. Please see nursing notes for vital signs. Test dose was given through epidural needle and negative prior to continuing to dose epidural or start infusion. Warning signs of high block given to the patient including shortness of breath, tingling/numbness in hands, complete motor block, or any concerning symptoms with instructions to call for help. Patient was given  instructions on fall risk and not to get out of bed. All questions and concerns addressed with instructions to call with any issues.     

## 2017-08-31 NOTE — Progress Notes (Signed)
Stephanie Choi is a 27 y.o. G1P0 at 9332w3d by LMP admitted for active labor  Subjective: Patient doing well. No concerns. Per nursing, had bloody show when she went to bathroom.   Objective: BP 115/66   Pulse 89   Temp 98 F (36.7 C) (Oral)   Resp 16   Ht 5\' 5"  (1.651 m)   Wt 104.3 kg (230 lb)   LMP 11/28/2016   BMI 38.27 kg/m  No intake/output data recorded. No intake/output data recorded.  FHT:  FHR: 140 bpm, variability: minimal ,  accelerations:  Present,  decelerations:  Absent UC:   irregular, every 3 minutes SVE:   Dilation: 5 Effacement (%): 70 Station: -2 Exam by:: Sharen CounterLisa Leftwich Kirby CNM  Labs: Lab Results  Component Value Date   WBC 6.7 08/31/2017   HGB 12.2 08/31/2017   HCT 35.8 (L) 08/31/2017   MCV 91.1 08/31/2017   PLT 261 08/31/2017    Assessment / Plan: Spontaneous labor, progressing normally  Labor: Progressing normally, will add Pitocin  Fetal Wellbeing:  Category I Pain Control:  Epidural I/D:  n/a Anticipated MOD:  NSVD  Stephanie Choi 08/31/2017, 4:31 PM
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
## 2017-09-01 ENCOUNTER — Encounter (HOSPITAL_COMMUNITY): Payer: Self-pay | Admitting: *Deleted

## 2017-09-01 LAB — RPR: RPR Ser Ql: NONREACTIVE

## 2017-09-01 MED ORDER — DIPHENHYDRAMINE HCL 25 MG PO CAPS: 25.0000 mg | ORAL_CAPSULE | Freq: Four times a day (QID) | ORAL | Status: DC | PRN

## 2017-09-01 MED ORDER — PRENATAL MULTIVITAMIN CH
1.0000 | ORAL_TABLET | Freq: Every day | ORAL | Status: DC
  Administered 2017-09-01 – 2017-09-02 (×2): 1 via ORAL
  Filled 2017-09-01 (×2): qty 1

## 2017-09-01 MED ORDER — DIBUCAINE 1 % RE OINT: 1.0000 "application " | TOPICAL_OINTMENT | RECTAL | Status: DC | PRN

## 2017-09-01 MED ORDER — IBUPROFEN 600 MG PO TABS
600.0000 mg | ORAL_TABLET | Freq: Four times a day (QID) | ORAL | Status: DC
  Administered 2017-09-01 – 2017-09-02 (×6): 600 mg via ORAL
  Filled 2017-09-01 (×6): qty 1

## 2017-09-01 MED ORDER — BENZOCAINE-MENTHOL 20-0.5 % EX AERO
1.0000 "application " | INHALATION_SPRAY | CUTANEOUS | Status: DC | PRN
  Administered 2017-09-01: 1 via TOPICAL
  Filled 2017-09-01: qty 56

## 2017-09-01 MED ORDER — COCONUT OIL OIL: 1.0000 "application " | TOPICAL_OIL | Status: DC | PRN

## 2017-09-01 MED ORDER — ZOLPIDEM TARTRATE 5 MG PO TABS: 5.0000 mg | ORAL_TABLET | Freq: Every evening | ORAL | Status: DC | PRN

## 2017-09-01 MED ORDER — SIMETHICONE 80 MG PO CHEW: 80.0000 mg | CHEWABLE_TABLET | ORAL | Status: DC | PRN

## 2017-09-01 MED ORDER — ACETAMINOPHEN 325 MG PO TABS
650.0000 mg | ORAL_TABLET | ORAL | Status: DC | PRN
  Administered 2017-09-01: 650 mg via ORAL

## 2017-09-01 MED ORDER — TETANUS-DIPHTH-ACELL PERTUSSIS 5-2.5-18.5 LF-MCG/0.5 IM SUSP: 0.5000 mL | Freq: Once | INTRAMUSCULAR | Status: DC

## 2017-09-01 MED ORDER — SENNOSIDES-DOCUSATE SODIUM 8.6-50 MG PO TABS
2.0000 | ORAL_TABLET | ORAL | Status: DC
  Administered 2017-09-01 – 2017-09-02 (×2): 2 via ORAL
  Filled 2017-09-01 (×2): qty 2

## 2017-09-01 MED ORDER — WITCH HAZEL-GLYCERIN EX PADS
1.0000 | MEDICATED_PAD | CUTANEOUS | Status: DC | PRN
Start: 2017-09-01 — End: 2017-09-02

## 2017-09-01 MED ORDER — ONDANSETRON HCL 4 MG PO TABS: 4.0000 mg | ORAL_TABLET | ORAL | Status: DC | PRN

## 2017-09-01 MED ORDER — ONDANSETRON HCL 4 MG/2ML IJ SOLN: 4.0000 mg | INTRAMUSCULAR | Status: DC | PRN
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
## 2017-09-01 NOTE — Progress Notes (Signed)
Patient ID: Stephanie Choi, female   DOB: [birth year], 27 y.o.   MRN: [id number]  Post Partum Day 1 Subjective: no complaints, tolerating PO and has been unable to walk because of residual epidural. Sitting comfortably in bed.  Objective: Vitals:   09/01/17 0330 09/01/17 0639  BP: (!) 129/55 111/65  Pulse: 78 74  Resp: 18 16  Temp:  98 F (36.7 C)    Breastfeeding: well  Physical Exam:  General: alert and cooperative Lochia: appropriate Uterine Fundus: firm Incision: n/a DVT Evaluation: No evidence of DVT seen on physical exam. No cords or calf tenderness.         Assessment/Plan: Plan for discharge tomorrow, Breastfeeding and Contraception barrier   LOS: 2 day

## 2017-09-01 NOTE — Anesthesia Postprocedure Evaluation (Signed)
Anesthesia Post Note  Patient: Stephanie Choi  Procedure(s) Performed: AN AD HOC LABOR EPIDURAL     Patient location during evaluation: Mother Baby Anesthesia Type: Epidural Level of consciousness: awake and alert Pain management: pain level controlled Vital Signs Assessment: post-procedure vital signs reviewed and stable Respiratory status: spontaneous breathing, nonlabored ventilation and respiratory function stable Cardiovascular status: stable Postop Assessment: no headache, no backache, epidural receding, patient able to bend at knees, adequate PO intake and no apparent nausea or vomiting Anesthetic complications: no    Last Vitals:  Vitals:   09/01/17 0330 09/01/17 0639  BP: (!) 129/55 111/65  Pulse: 78 74  Resp: 18 16  Temp:  36.7 C    Last Pain:  Vitals:   09/01/17 0757  TempSrc:   PainSc: 2    Pain Goal:                 Laban EmperorMalinova,Dejah Droessler Hristova

## 2017-09-02 MED ORDER — IBUPROFEN 600 MG PO TABS: 600.0000 mg | ORAL_TABLET | Freq: Four times a day (QID) | ORAL | 0 refills | Status: AC

## 2017-09-02 MED ORDER — ONDANSETRON 8 MG PO TBDP: 4.0000 mg | ORAL_TABLET | Freq: Three times a day (TID) | ORAL | 3 refills | Status: AC | PRN

## 2017-09-02 MED ORDER — INFLUENZA VAC SPLIT QUAD 0.5 ML IM SUSY: 0.5000 mL | PREFILLED_SYRINGE | INTRAMUSCULAR | Status: DC

## 2017-09-02 NOTE — Lactation Note (Signed)
This note was copied from a baby's chart. Lactation Consultation Note  Patient Name: Girl Jacquiline DoeOmoyeni Moulder ZOXWR'UToday's Date: 09/02/2017 Reason for consult: Initial assessment   P1, Baby 35 hours old.   Reviewed hand expression w/ drops expressed.  Mother excited. Assisted w/ latching in cross cradle hold w breast compressions. Baby sleepy at the breast. Mom encouraged to feed baby 8-12 times/24 hours and with feeding cues.  Nipples evert and compressible. Noticed baby has had short feedings of 5-10 min.  Reviewed waking techniques. Mom made aware of O/P services, breastfeeding support groups, community resources, and our phone # for post-discharge questions.      Maternal Data Has patient been taught Hand Expression?: Yes Does the patient have breastfeeding experience prior to this delivery?: No  Feeding Feeding Type: Breast Fed  LATCH Score Latch: Grasps breast easily, tongue down, lips flanged, rhythmical sucking.  Audible Swallowing: A few with stimulation  Type of Nipple: Everted at rest and after stimulation  Comfort (Breast/Nipple): Soft / non-tender  Hold (Positioning): Assistance needed to correctly position infant at breast and maintain latch.  LATCH Score: 8  Interventions Interventions: Assisted with latch;Hand express;Support pillows;Breast compression  Lactation Tools Discussed/Used     Consult Status Consult Status: Follow-up Date: 09/03/17 Follow-up type: In-patient    Dahlia ByesBerkelhammer, Chee Kinslow [facility] 09/02/2017, 11:03 AM

## 2017-09-02 NOTE — Discharge Summary (Signed)
OB Discharge Summary     Patient Name: Stephanie Choi DOB: 1990-11-03 MRN: [id number]  Date of admission: 08/31/2017 Delivering MD: Freddrick March   Date of discharge: 09/02/2017  Admitting diagnosis: 39 WKS THINKS WATER BROKE HAVING CTX Intrauterine pregnancy: [redacted]w[redacted]d     Secondary diagnosis:  Active Problems:   Hyperemesis affecting pregnancy, antepartum   Supervision of normal pregnancy   SVD (spontaneous vaginal delivery)   Discharge diagnosis: Term Pregnancy Delivered                                                                                                Post partum procedures:n/a  Augmentation: Pitocin  Complications: None  Hospital course:  Onset of Labor With Vaginal Delivery     27 y.o. yo G1P1001 at [redacted]w[redacted]d was admitted in Active Labor on 08/31/2017. Patient had an uncomplicated labor course as follows:  Membrane Rupture Time/Date: 5:50 PM ,08/31/2017   Intrapartum Procedures: Episiotomy: None [1]                                         Lacerations:  Sulcus [9]  Patient had a delivery of a Viable infant. 08/31/2017  Information for the patient's newborn:  Amijah, Timothy Girl Joylynn [[id number]]  Delivery Method: Vaginal, Spontaneous(Filed from Delivery Summary)    Pateint had an uncomplicated postpartum course.  She is ambulating, tolerating a regular diet, passing flatus, and urinating well. Patient is discharged home in stable condition on 09/02/17.   Physical exam  Vitals:   09/01/17 0639 09/01/17 1155 09/01/17 1834 09/02/17 0500  BP: 111/65 103/65 102/64 (!) 100/47  Pulse: 74 63 82 86  Resp: 16 16 18 18   Temp: 98 F (36.7 C) 97.7 F (36.5 C) 98.8 F (37.1 C) 98.4 F (36.9 C)  TempSrc: Oral Oral Oral Oral  SpO2:   100%   Weight:      Height:       General: alert Lochia: appropriate Uterine Fundus: firm Incision: N/A DVT Evaluation: No evidence of DVT seen on physical exam. No cords or calf tenderness. No significant calf/ankle edema. Labs: Lab Results   Component Value Date   WBC 6.7 08/31/2017   HGB 12.2 08/31/2017   HCT 35.8 (L) 08/31/2017   MCV 91.1 08/31/2017   PLT 261 08/31/2017   CMP Latest Ref Rng & Units 03/19/2017  Glucose 65 - 99 mg/dL 84  BUN 6 - 20 mg/dL 6  Creatinine 7.82 - 9.56 mg/dL 2.13  Sodium 086 - 578 mmol/L 134(L)  Potassium 3.5 - 5.1 mmol/L 3.6  Chloride 101 - 111 mmol/L 103  CO2 22 - 32 mmol/L 23  Calcium 8.9 - 10.3 mg/dL 9.2  Total Protein 6.5 - 8.1 g/dL 7.3  Total Bilirubin 0.3 - 1.2 mg/dL 0.4  Alkaline Phos 38 - 126 U/L 34(L)  AST 15 - 41 U/L 17  ALT 14 - 54 U/L 15    Discharge instruction: per After Visit Summary and "Baby and Me Booklet".  After visit meds:  Allergies  as of 09/02/2017   No Known Allergies     Medication List    TAKE these medications   calcium carbonate 500 MG chewable tablet Commonly known as:  TUMS - dosed in mg elemental calcium Chew 2 tablets by mouth 2 (two) times daily as needed for indigestion or heartburn.   flintstones complete 60 MG chewable tablet Chew 2 tablets by mouth at bedtime.   Geritol Liqd Take 15 mLs by mouth at bedtime.   hydrocortisone-pramoxine rectal foam Commonly known as:  PROCTOFOAM-HC Place 1 applicator rectally 2 (two) times daily.   ibuprofen 600 MG tablet Commonly known as:  ADVIL,MOTRIN Take 1 tablet (600 mg total) by mouth every 6 (six) hours.   ondansetron 8 MG disintegrating tablet Commonly known as:  ZOFRAN ODT Take 0.5 tablets (4 mg total) by mouth every 8 (eight) hours as needed for nausea or vomiting. What changed:  how much to take       Diet: routine diet  Activity: Advance as tolerated. Pelvic rest for 6 weeks.   Outpatient follow up:4 weeks Follow up Appt:No future appointments. Follow up Visit: Follow-up Information    [facility] Schedule an appointment as soon as possible for a visit in 4 week(s).   Contact information: [address] [place] [address] [phone]            Postpartum contraception: Natural Family Planning and Condoms  Newborn Data: Live born female  Birth Weight: 7 lb 1.9 oz (3229 g) APGAR: 8, 9  Newborn Delivery   Birth date/time:  08/31/2017 23:21:00 Delivery type:  Vaginal, Spontaneous     Baby Feeding: Bottle and Breast Disposition:home with mother   09/02/2017 Alroy BailiffParker W Leland, MD  OB FELLOW DISCHARGE ATTESTATION  I have seen and examined this patient. I agree with above documentation and have made edits as needed.   Caryl AdaJazma Batya Citron, DO OB Fellow

## 2019-01-16 IMAGING — US US MFM FETAL NUCHAL TRANSLUCENCY
1 series · 15 of 28 positions shown · non-contrast
Comparison: none

[Series 1: us mfm fetal nuchal translucency · 15 of 33 slices shown]
[im 1/33]
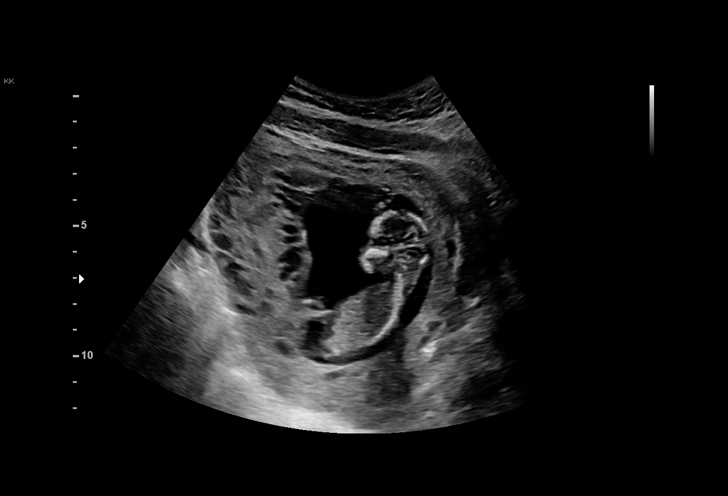
[im 3/33]
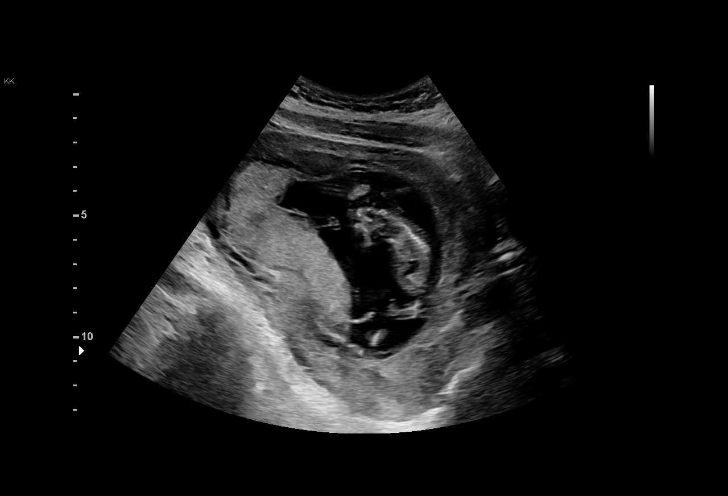
[im 5/33]
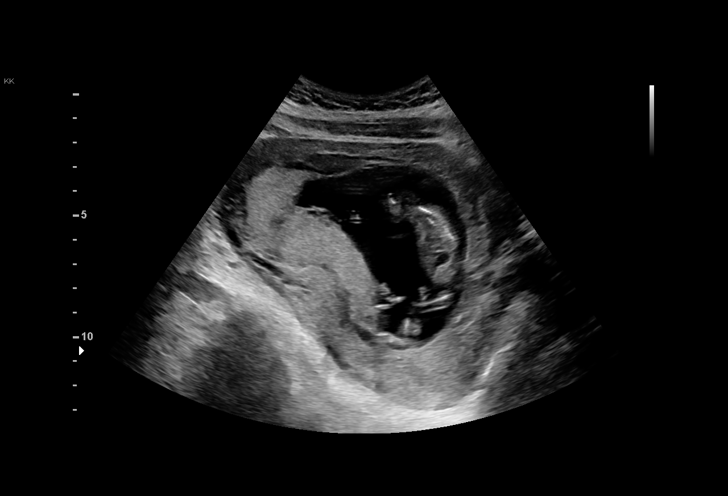
[im 8/33]
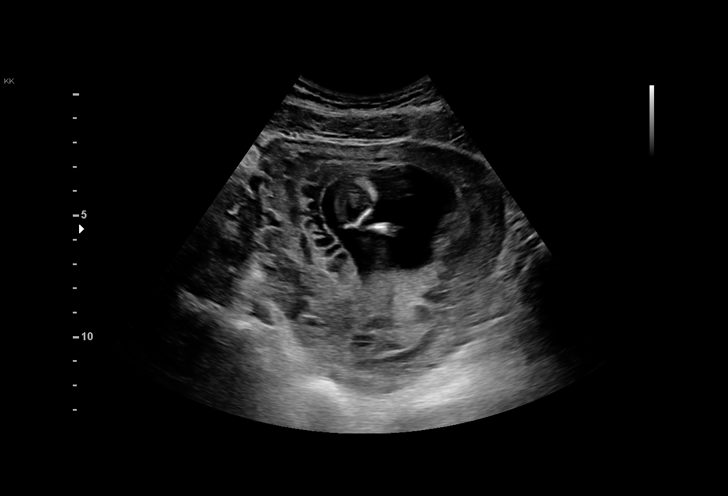
[im 10/33]
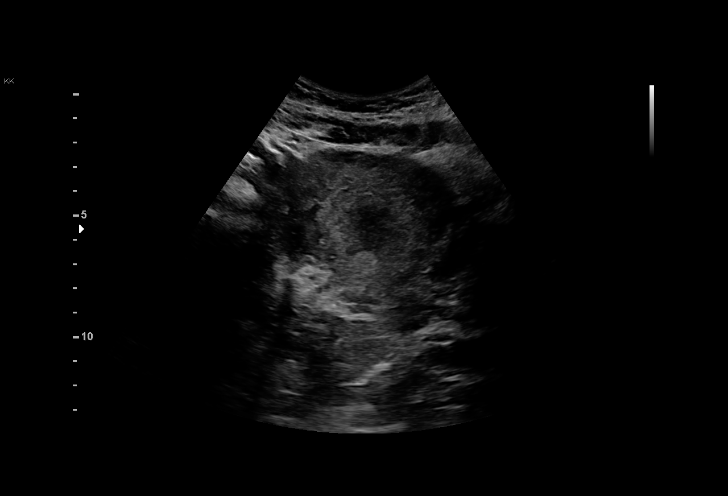
[im 12/33]
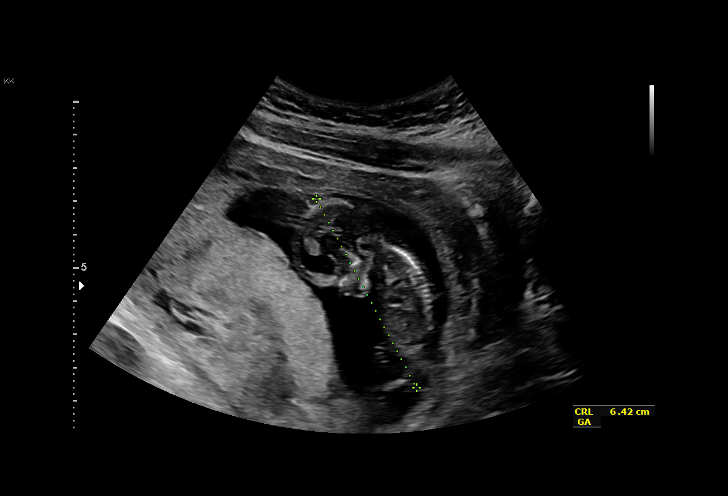
[im 15/33]
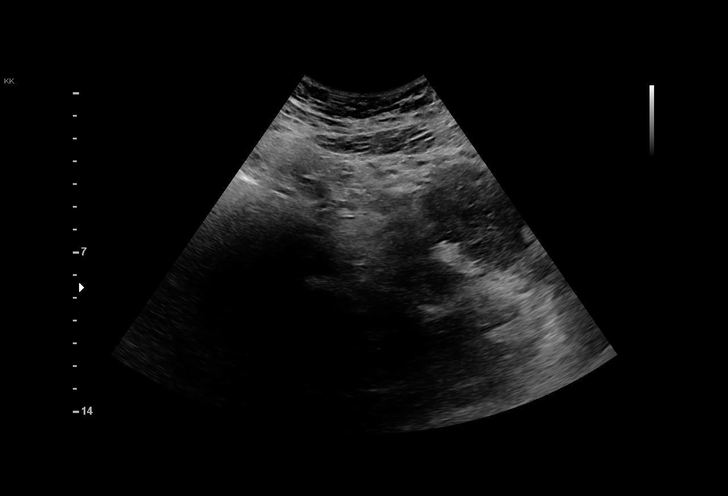
[im 17/33]
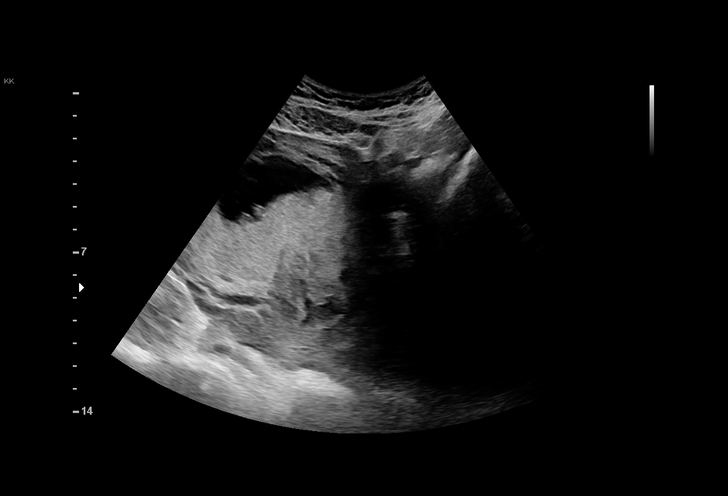
[im 18/33]
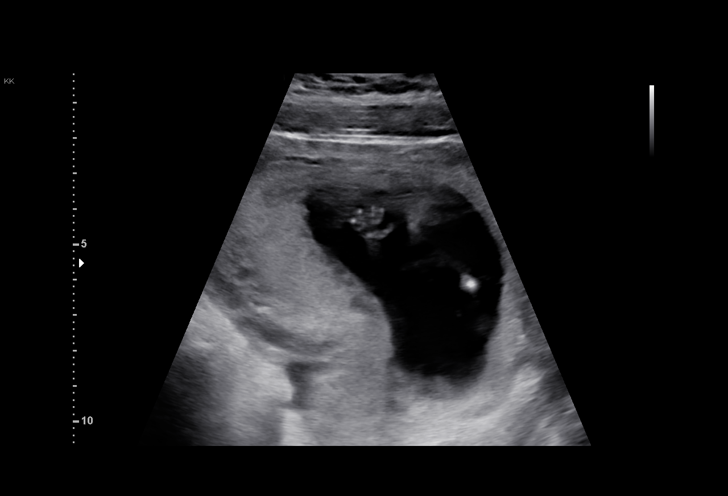
[im 21/33]
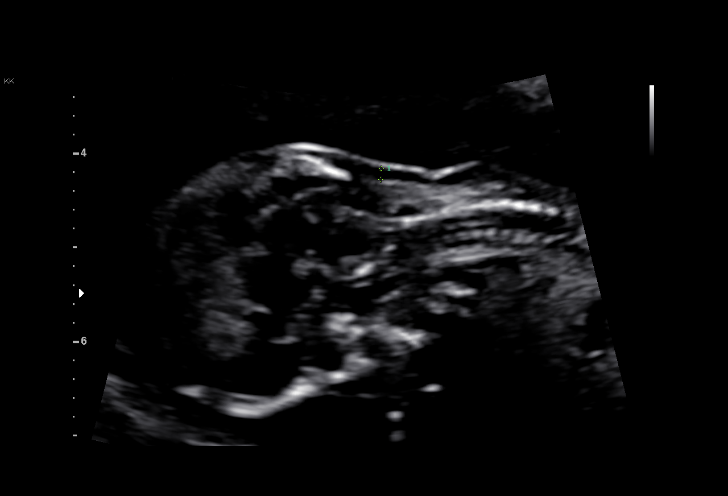
[im 23/33]
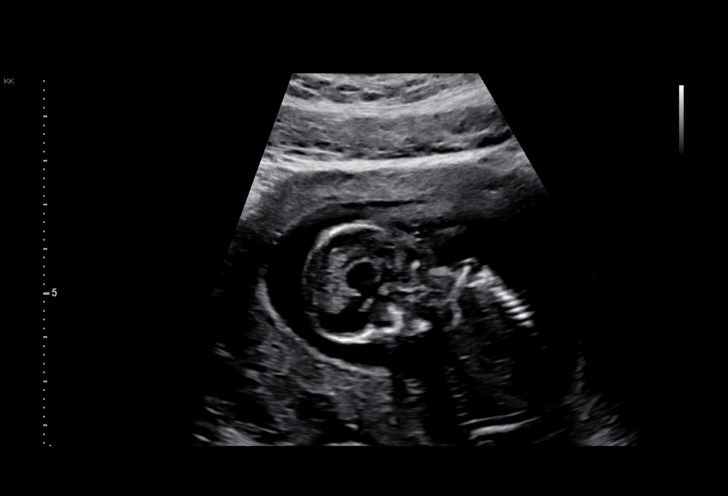
[im 25/33]
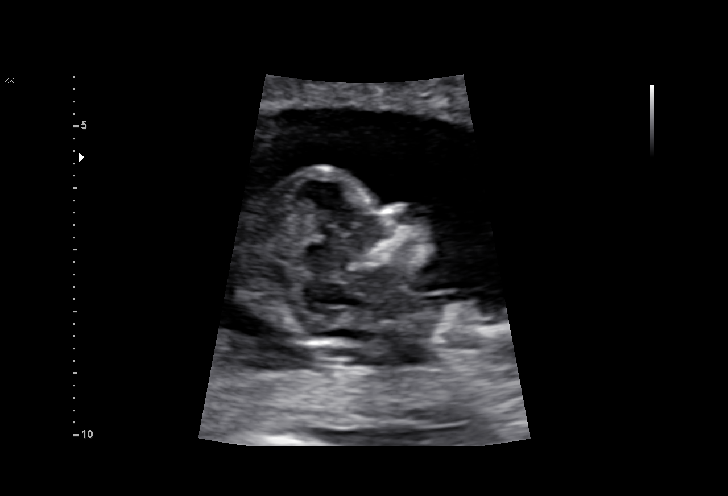
[im 28/33]
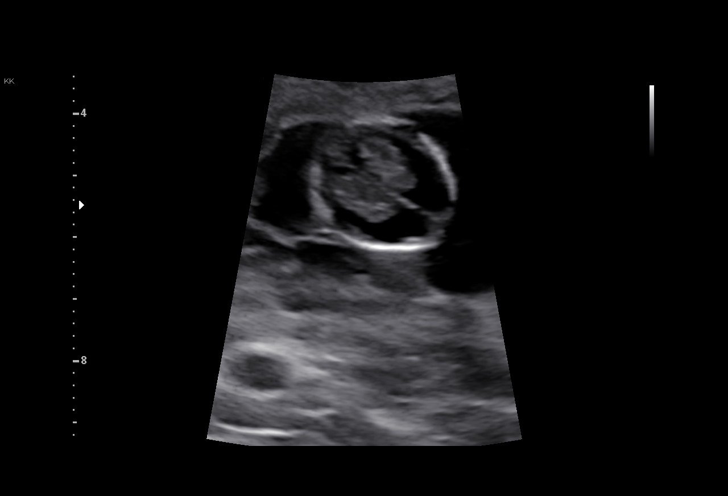
[im 30/33]
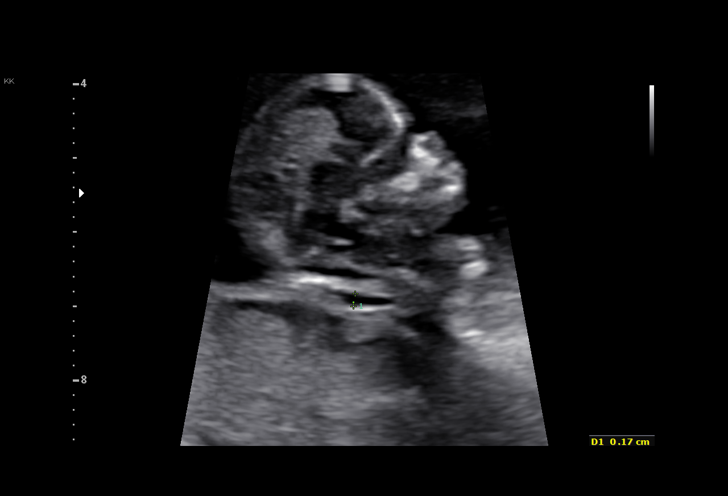
[im 33/33]
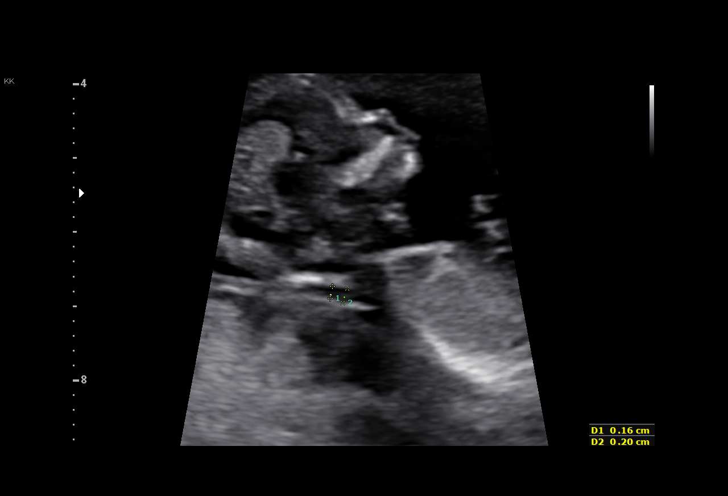

[15 of 28 positions shown; findings below may reference images not displayed]

[REDACTED]-
Faculty Physician

TRANSLUCENCY

1  KLERANT ROENA            [id number]      [id number]     [id number]
Indications

12 weeks gestation of pregnancy
Encounter for nuchal translucency
Obesity complicating pregnancy, second
trimester
OB History

Blood Type:            Height:  5'5"   Weight (lb):  218      BMI:
Gravidity:    1         Term:   0        Prem:   0        SAB:   0
TOP:          0       Ectopic:  0        Living: 0
Fetal Evaluation

Num Of Fetuses:     1
Fetal Heart         161
Rate(bpm):
Cardiac Activity:   Observed
Presentation:       Variable

Amniotic Fluid
AFI FV:      Subjectively within normal limits
Gestational Age

LMP:           12w 5d       Date:   11/28/16                 EDD:   09/04/17
Best:          12w 5d    Det. By:   LMP  (11/28/16)          EDD:   09/04/17
1st Trimester Genetic Sonogram Screening
CRL:            65.3  mm    G. Age:   12w 5d                 EDD:   09/04/17
Nuc Trans:       1.5  mm

Nasal Bone:                 Present
Anatomy

Cranium:               Appears normal         Upper Extremities:      Present
Choroid Plexus:        Appears normal         Lower Extremities:      Present
Stomach:               Appears normal, left
sided
Cervix Uterus Adnexa

Cervix
Normal appearance by transabdominal scan.

Adnexa:       No abnormality visualized.
Impression

SIUP at 12+5 weeks
No gross abnormalities identified
NT measurement was within normal limits for this GA; NB
present
Normal amniotic fluid volume
Measurements consistent with LMP dating
Recommendations

Offer MSAFP in the second trimester for ONTD screening
Offer anatomy U/S by 18 weeks

## 2019-04-03 IMAGING — US US MFM OB DETAIL+14 WK
1 series · 14 of 28 positions shown · non-contrast
Comparison: none

[Series 1: us mfm ob detail+14 wk · 14 of 98 slices shown]
[im 4/98]
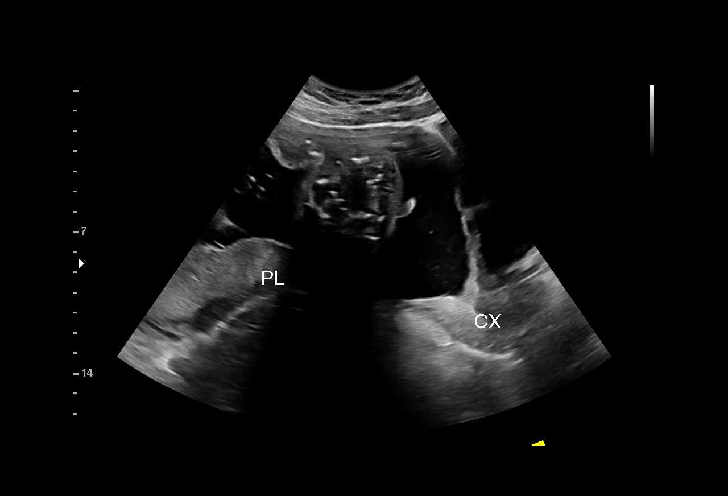
[im 11/98]
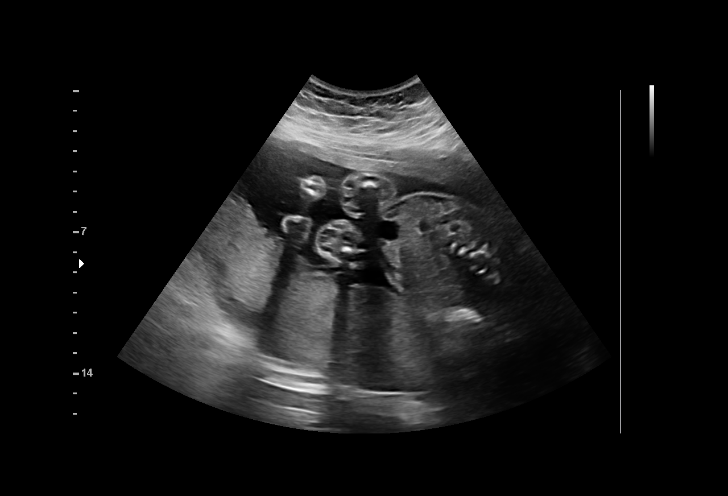
[im 18/98]
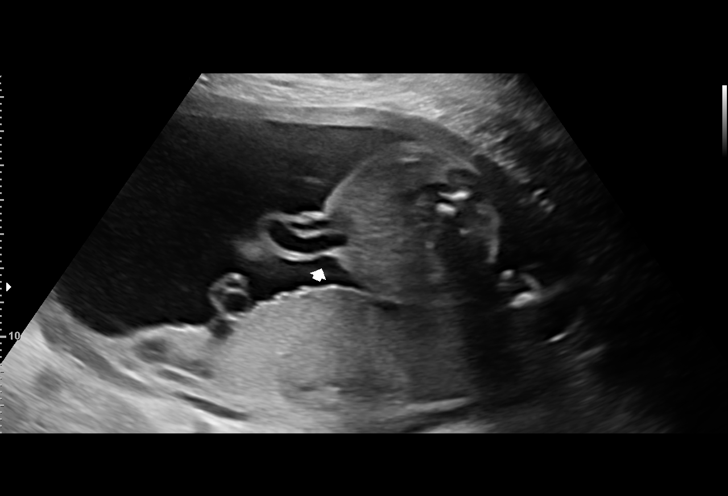
[im 26/98]
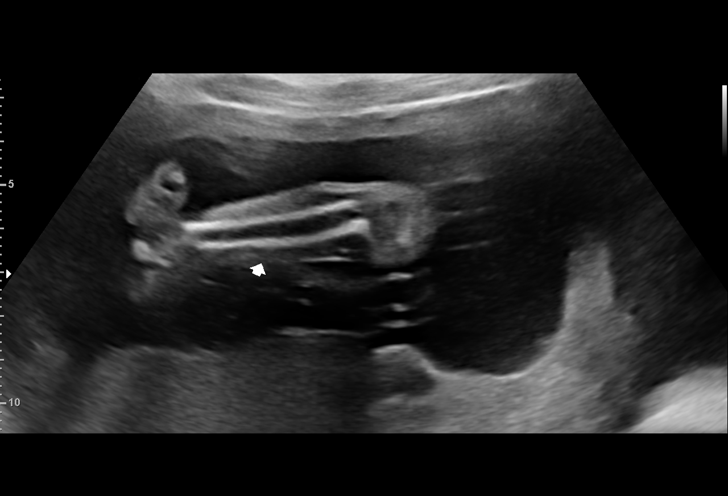
[im 33/98]
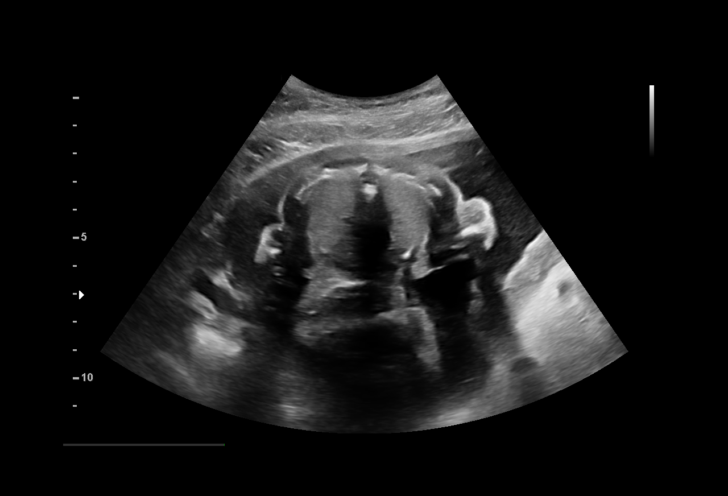
[im 40/98]
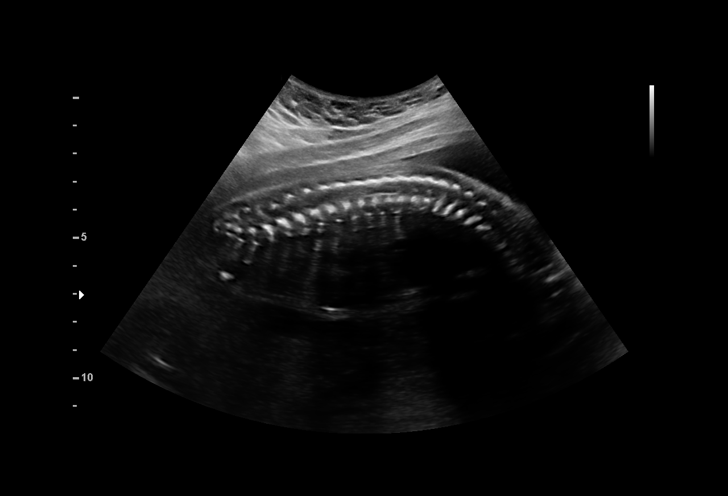
[im 47/98]
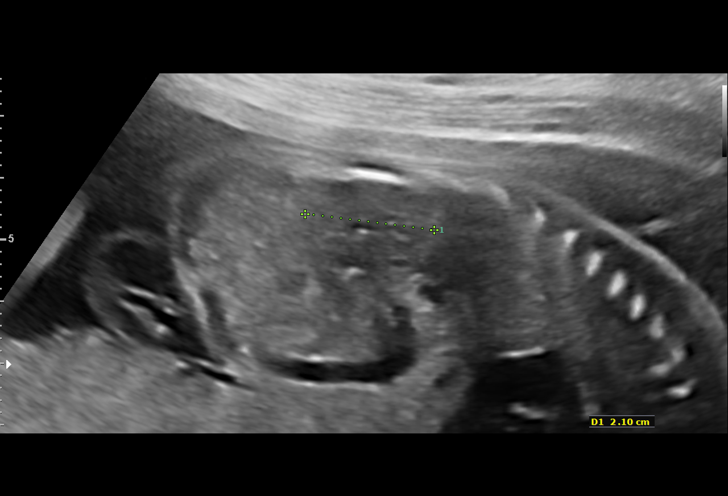
[im 54/98]
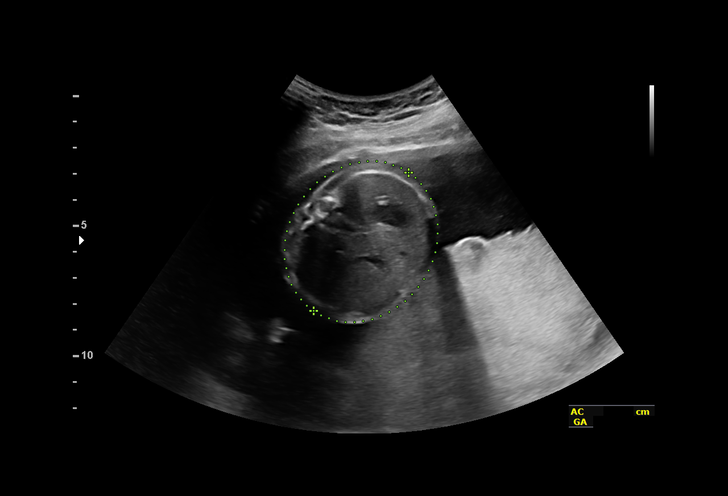
[im 62/98]
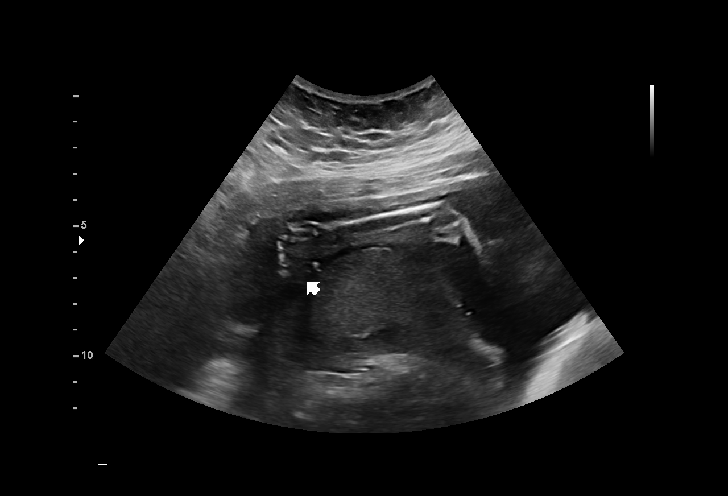
[im 69/98]
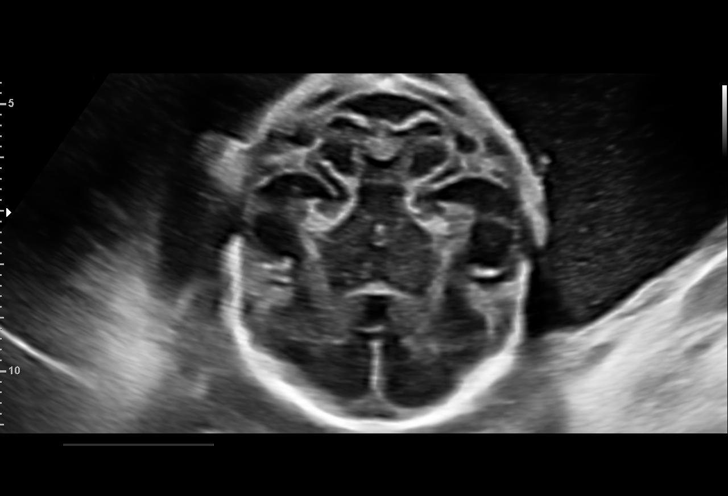
[im 76/98]
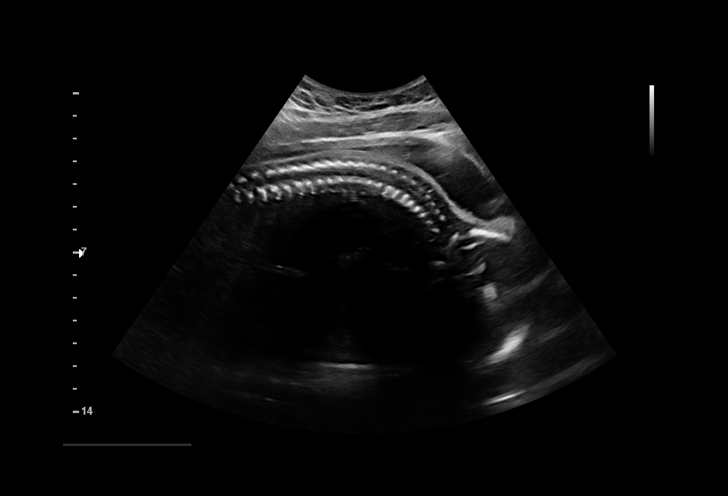
[im 83/98]
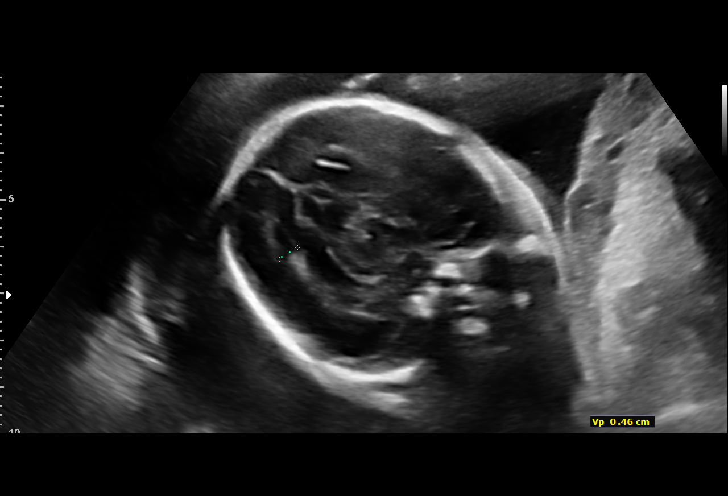
[im 90/98]
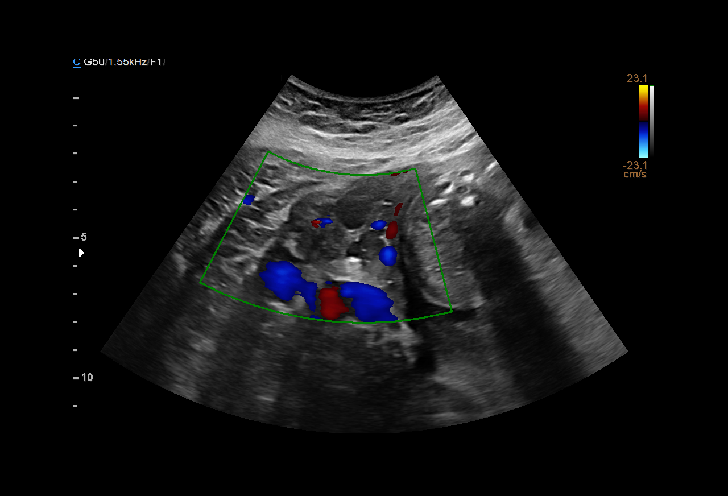
[im 98/98]
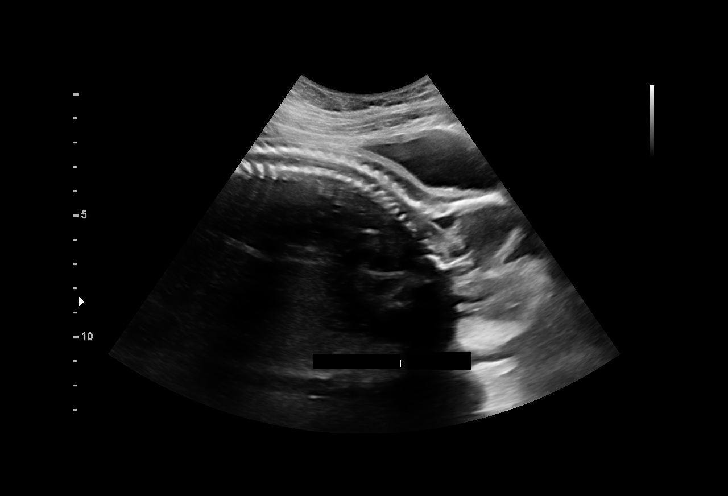

[14 of 28 positions shown; findings below may reference images not displayed]

[REDACTED]-
Faculty Physician

1  AMMADO REEF BANDUSIRI            [id number]      [id number]     [id number]
Indications

23 weeks gestation of pregnancy
Obesity complicating pregnancy, second
trimester
Encounter for fetal anatomic survey
OB History

Blood Type:            Height:  5'5"   Weight (lb):  218      BMI:
Gravidity:    1         Term:   0        Prem:   0        SAB:   0
TOP:          0       Ectopic:  0        Living: 0
Fetal Evaluation

Num Of Fetuses:     1
Fetal Heart         159
Rate(bpm):
Cardiac Activity:   Observed
Presentation:       Cephalic
Placenta:           Posterior, above cervical os
P. Cord Insertion:  Visualized

Amniotic Fluid
AFI FV:      Subjectively within normal limits

Largest Pocket(cm)
5.97
Biometry
BPD:      57.7  mm     G. Age:  23w 5d         42  %    CI:        76.37   %   70 - 86
FL/HC:      21.1   %   18.7 -
HC:      209.2  mm     G. Age:  23w 0d         12  %    HC/AC:      1.09       1.05 -
AC:      191.1  mm     G. Age:  23w 6d         46  %    FL/BPD:     76.6   %   71 - 87
FL:       44.2  mm     G. Age:  24w 4d         65  %    FL/AC:      23.1   %   20 - 24
HUM:      44.2  mm     G. Age:  26w 2d       > 95  %

Est. FW:     652  gm      1 lb 7 oz     57  %
Gestational Age

LMP:           23w 5d       Date:   11/28/16                 EDD:   09/04/17
U/S Today:     23w 6d                                        EDD:   09/03/17
Best:          23w 5d    Det. By:   LMP  (11/28/16)          EDD:   09/04/17
Anatomy

Cranium:               Appears normal         Aortic Arch:            Appears normal
Cavum:                 Appears normal         Ductal Arch:            Not well visualized
Ventricles:            Appears normal         Diaphragm:              Appears normal
Choroid Plexus:        Appears normal         Stomach:                Appears normal, left
sided
Cerebellum:            Appears normal         Abdomen:                Appears normal
Posterior Fossa:       Appears normal         Abdominal Wall:         Appears nml (cord
insert, abd wall)
Nuchal Fold:           Not applicable (>20    Cord Vessels:           Appears normal (3
wks GA)                                        vessel cord)
Face:                  Not well visualized    Kidneys:                Appear normal
Lips:                  Not well visualized    Bladder:                Appears normal
Thoracic:              Appears normal         Spine:                  Appears normal
Heart:                 Not well visualized    Upper Extremities:      Appears normal;
hands nwv
RVOT:                  Not well visualized    Lower Extremities:      Appears normal
LVOT:                  Appears normal

Other:  Fetus appears to be a female. Heels visualized. Technically difficult
due to maternal habitus and fetal position.
Cervix Uterus Adnexa

Cervix
Length:           3.45  cm.
Normal appearance by transabdominal scan.

Uterus
No abnormality visualized.

Left Ovary
Size(cm)       2.8 x    1.2    x  2.2       Vol(ml):
Within normal limits.

Right Ovary
Size(cm)       2.5 x    2.3    x  2.2       Vol(ml):
Within normal limits.
Impression

SIUP at 64w9d in gestation complicated by maternal morbid
obesity
active fetus
EFW 57th%'le
no dysmorphic features
limited views as above
cervix is long and closed conferring low risk for preterm
delivery
no previa
Recommendations

Given morbid obesity, interval growth in 6 weeks with attempt
to complete survey at that time.

## 2019-05-15 IMAGING — US US MFM OB FOLLOW-UP
1 series · 14 of 28 positions shown · non-contrast
Comparison: none

[Series 1: us mfm ob follow-up · 43 acquisitions, 14 frames shown]
[im 2/43]
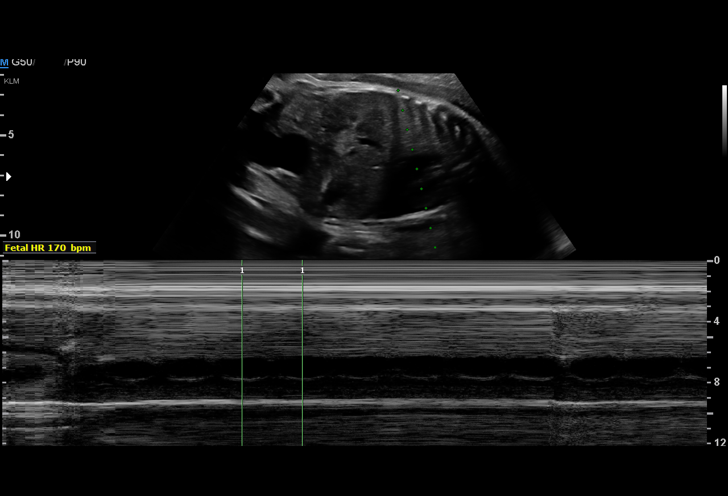
[im 5/43]
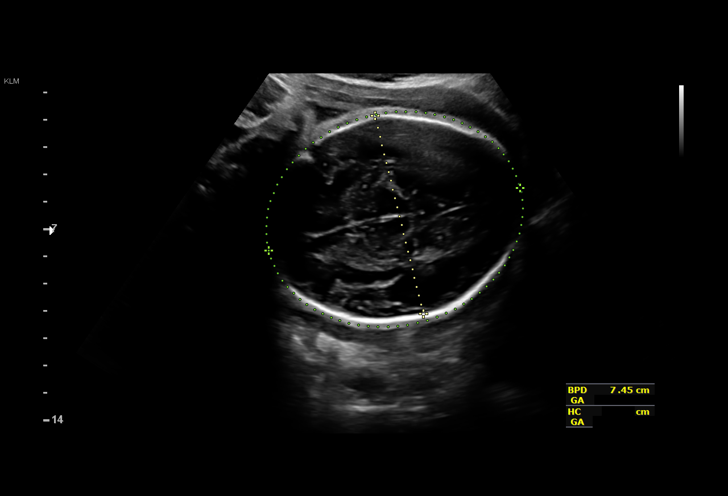
[im 8/43]
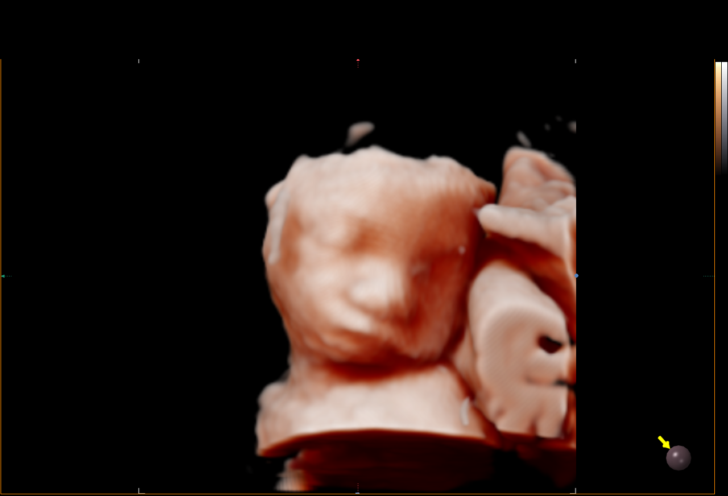
[im 11/43]
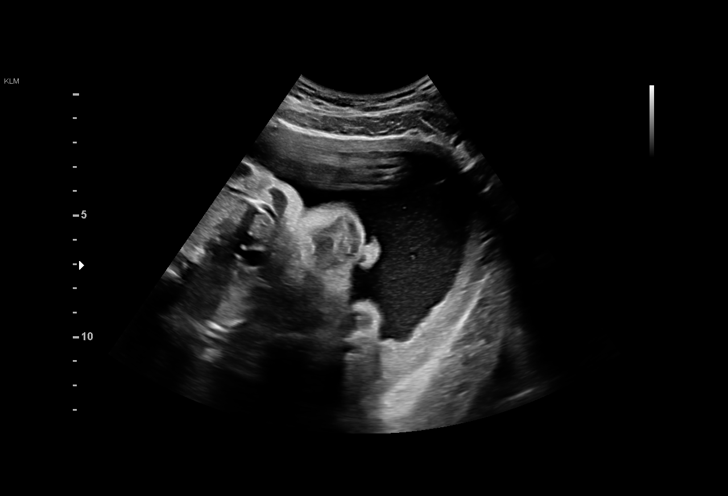
[im 15/43]
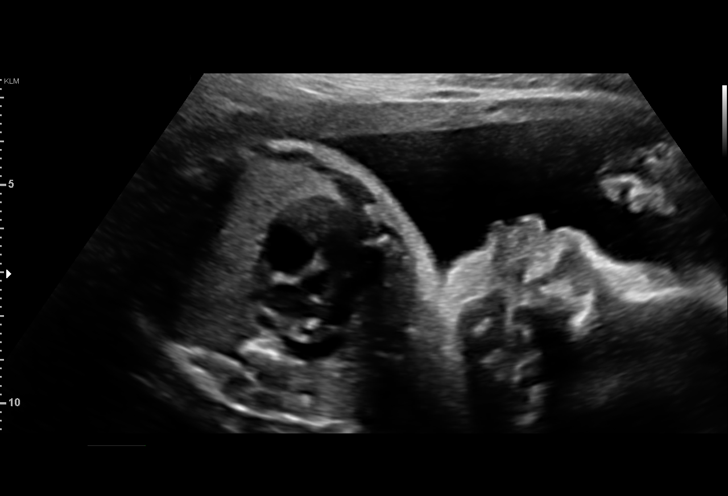
[im 18/43]
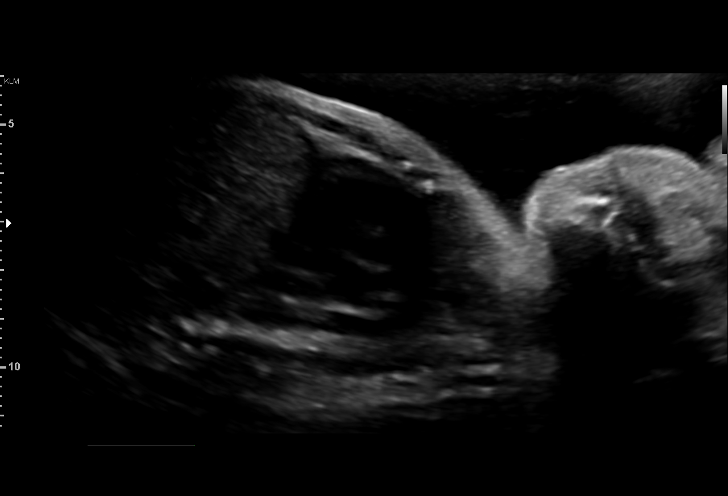
[im 21/43]
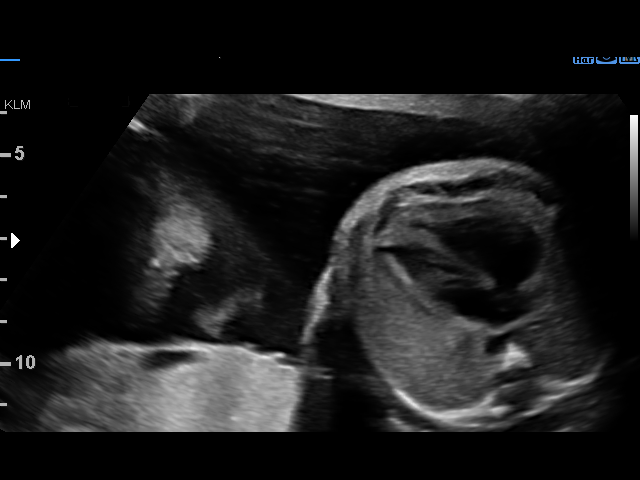
[im 24/43]
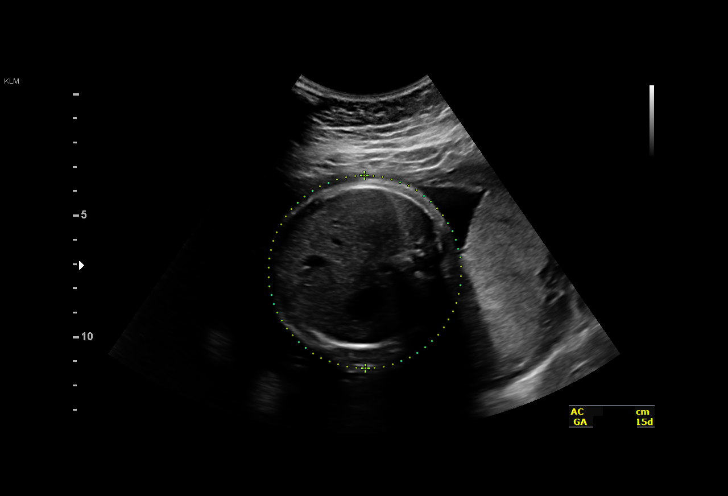
[im 27/43]
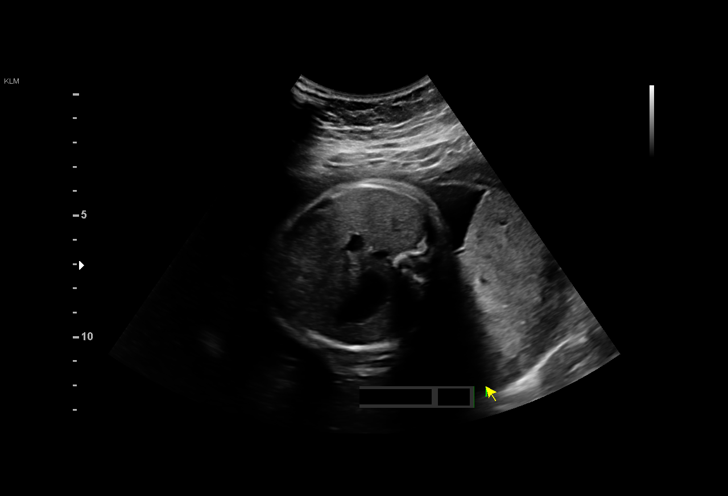
[im 30/43]
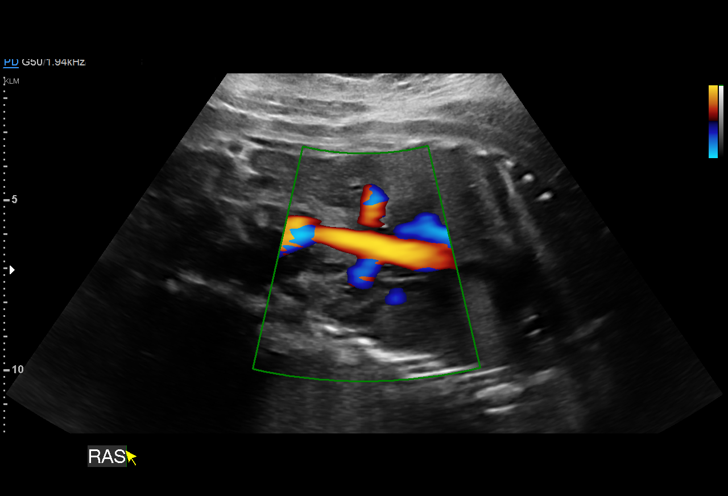
[im 33/43]
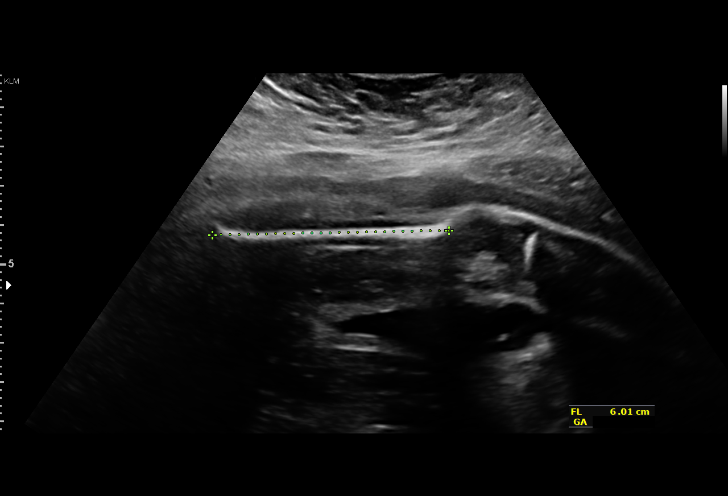
[im 36/43]
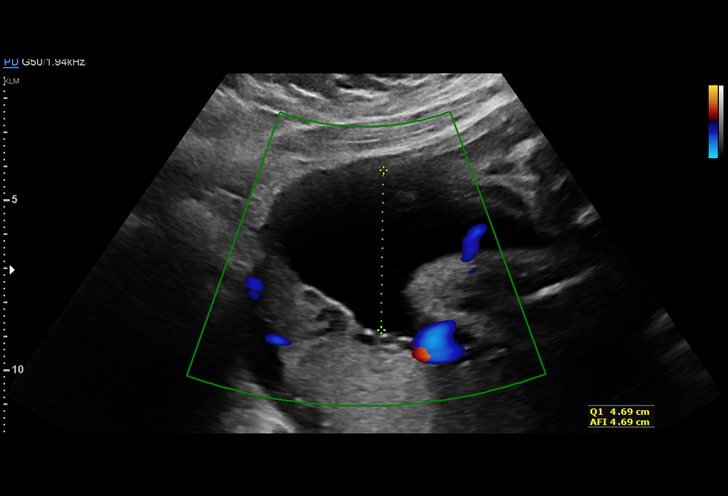
[im 39/43]
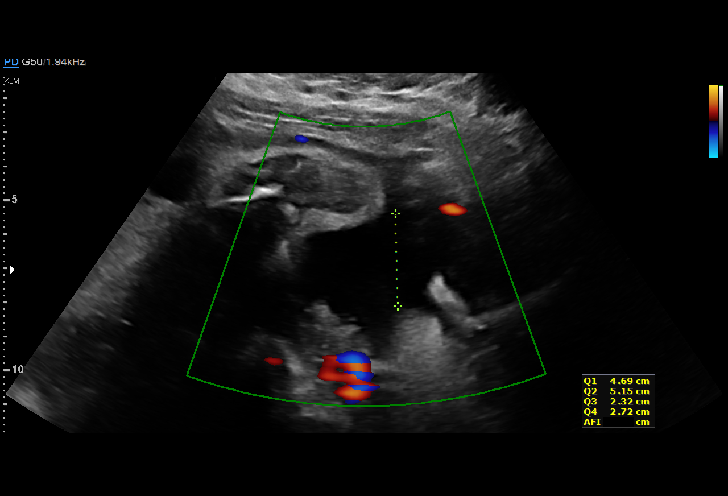
[im 43/43]
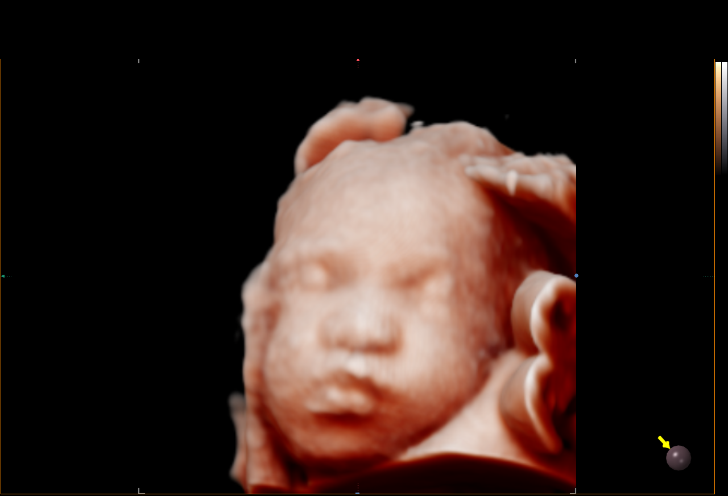

[14 of 28 positions shown; findings below may reference images not displayed]

[REDACTED]-
Faculty Physician

Indications

29 weeks gestation of pregnancy
Obesity complicating pregnancy, third
trimester
Antenatal follow-up for nonvisualized fetal
anatomy
OB History

Blood Type:            Height:  5'5"   Weight (lb):  218       BMI:
Gravidity:    1         Term:   0        Prem:   0        SAB:   0
TOP:          0       Ectopic:  0        Living: 0
Fetal Evaluation

Num Of Fetuses:     1
Fetal Heart         170
Rate(bpm):
Cardiac Activity:   Observed
Presentation:       Cephalic
Placenta:           Posterior, above cervical os
P. Cord Insertion:  Previously Visualized

Amniotic Fluid
AFI FV:      Subjectively within normal limits

AFI Sum(cm)     %Tile       Largest Pocket(cm)
14.88           52

RUQ(cm)       RLQ(cm)       LUQ(cm)        LLQ(cm)
4.69
Biometry

BPD:      74.6  mm     G. Age:  30w 0d         44  %    CI:        74.99   %    70 - 86
FL/HC:      21.8   %    19.2 -
HC:      273.3  mm     G. Age:  29w 6d         21  %    HC/AC:      1.10        0.99 -
AC:      249.2  mm     G. Age:  29w 1d         28  %    FL/BPD:     80.0   %    71 - 87
FL:       59.7  mm     G. Age:  31w 1d         74  %    FL/AC:      24.0   %    20 - 24

Est. FW:    3661  gm      3 lb 4 oz     56  %
Gestational Age

LMP:           29w 5d        Date:  11/28/16                 EDD:   09/04/17
U/S Today:     30w 0d                                        EDD:   09/02/17
Best:          29w 5d     Det. By:  LMP  (11/28/16)          EDD:   09/04/17
Anatomy

Cranium:               Appears normal         Aortic Arch:            Appears normal
Cavum:                 Previously seen        Ductal Arch:            Appears normal
Ventricles:            Appears normal         Diaphragm:              Appears normal
Choroid Plexus:        Previously seen        Stomach:                Appears normal, left
sided
Cerebellum:            Previously seen        Abdomen:                Appears normal
Posterior Fossa:       Previously seen        Abdominal Wall:         Previously seen
Nuchal Fold:           Not applicable (>20    Cord Vessels:           Previously seen
wks GA)
Face:                  Appears normal         Kidneys:                Appear normal
(orbits and profile)
Lips:                  Appears normal         Bladder:                Appears normal
Thoracic:              Appears normal         Spine:                  Previously seen
Heart:                 Appears normal         Upper Extremities:      Previously seen
(4CH, axis, and situs
RVOT:                  Appears normal         Lower Extremities:      Previously seen
LVOT:                  Appears normal

Other:  Fetus appears to be a female. Heels previously visualized.
Technically difficult due to maternal habitus and fetal position.
Cervix Uterus Adnexa

Cervix
Not visualized (advanced GA >09wks)
Impression

Single living intrauterine pregnancy at 29 weeks 5 days.
Appropriate fetal growth (56%).
Normal amniotic fluid volume.
The fetal anatomic survey is complete.
Normal fetal anatomy.
No fetal anomalies or soft markers of aneuploidy seen.
Recommendations

Follow-up ultrasounds as clinically indicated.
